# Patient Record
Sex: Female | Born: 1960 | Race: White | Hispanic: No | Marital: Married | Smoking: Never smoker
Health system: Southern US, Community
[De-identification: ages and names within clinical notes are randomized; demographics above are authoritative.]

## PROBLEM LIST (undated history)

## (undated) ENCOUNTER — Emergency Department (HOSPITAL_BASED_OUTPATIENT_CLINIC_OR_DEPARTMENT_OTHER): Admission: EM | Payer: Self-pay | Source: Home / Self Care

## (undated) DIAGNOSIS — K219 Gastro-esophageal reflux disease without esophagitis: Secondary | ICD-10-CM

## (undated) DIAGNOSIS — J069 Acute upper respiratory infection, unspecified: Secondary | ICD-10-CM

## (undated) DIAGNOSIS — Z9889 Other specified postprocedural states: Secondary | ICD-10-CM

## (undated) DIAGNOSIS — T7840XA Allergy, unspecified, initial encounter: Secondary | ICD-10-CM

## (undated) DIAGNOSIS — Z8619 Personal history of other infectious and parasitic diseases: Secondary | ICD-10-CM

## (undated) DIAGNOSIS — I10 Essential (primary) hypertension: Secondary | ICD-10-CM

## (undated) DIAGNOSIS — E785 Hyperlipidemia, unspecified: Secondary | ICD-10-CM

## (undated) DIAGNOSIS — I219 Acute myocardial infarction, unspecified: Secondary | ICD-10-CM

## (undated) HISTORY — DX: Hyperlipidemia, unspecified: E78.5

## (undated) HISTORY — DX: Gastro-esophageal reflux disease without esophagitis: K21.9

## (undated) HISTORY — DX: Acute myocardial infarction, unspecified: I21.9

## (undated) HISTORY — PX: WISDOM TOOTH EXTRACTION: SHX21

## (undated) HISTORY — PX: DILATION AND CURETTAGE OF UTERUS: SHX78

## (undated) HISTORY — DX: Essential (primary) hypertension: I10

## (undated) HISTORY — PX: ADENOIDECTOMY: SUR15

## (undated) HISTORY — DX: Personal history of other infectious and parasitic diseases: Z86.19

## (undated) HISTORY — DX: Other specified postprocedural states: Z98.890

## (undated) HISTORY — DX: Allergy, unspecified, initial encounter: T78.40XA

## (undated) HISTORY — DX: Acute upper respiratory infection, unspecified: J06.9

## (undated) HISTORY — PX: TONSILLECTOMY: SUR1361

---

## 2013-05-13 LAB — HM COLONOSCOPY

## 2017-08-20 LAB — HM MAMMOGRAPHY

## 2018-05-12 ENCOUNTER — Ambulatory Visit: Payer: BLUE CROSS/BLUE SHIELD | Admitting: Family Medicine

## 2018-05-12 ENCOUNTER — Telehealth: Payer: Self-pay | Admitting: Family Medicine

## 2018-05-12 NOTE — Telephone Encounter (Signed)
See note  Copied from [place] [id number]. Topic: Appointment Scheduling - Scheduling Inquiry for Clinic >> May 12, 2018  8:42 AM Burchel, Abbi R wrote: Reason for CRM:   Pt called to ask about her new pt appt bc she also has a cough/congestion.  She was offered an evisit instead and declined bc she has a rash she would also like examined.  Please advise asap.

## 2018-05-12 NOTE — Telephone Encounter (Signed)
Called and spoke with patient and advised that we will have to cancel her NP EST appt at this time.  Due to patient being symptomatic w/cough and congestion, we are not seeing patients in our office at this time.  Patient states that she has hypertension and reports that she was taking Mucinex and Allegra currently for her symptoms.  She was concerned about any medication affecting her bp.  Advised Coricidin HBP and push fluids, self-quarantine for 7-14 days depending on symptoms.  Patient also reports that they are in the process of moving and need to direct movers where to put furniture etc.  I advised to maintain social distance of at least 6 ft.  Advised her that we would cancel her appt and she can c/b to r/s at a later date.  She verbalized understanding

## 2018-05-20 ENCOUNTER — Ambulatory Visit: Payer: Self-pay | Admitting: *Deleted

## 2018-05-20 NOTE — Telephone Encounter (Signed)
Pt called and left a voicemail that stated she is still not feeling better and would like a call back from nurse. Please advise

## 2018-05-20 NOTE — Telephone Encounter (Signed)
  Pt called in c/o a post nasal drip and a cough that is not getting better.   "I just moved down here from [place]".   "I don't know if it's the allergies here or what".   She had an appt to meet and greet with Dr. Yves Dill however the appt was canceled due to the coronavirus pandemic going on right now. She was wanting a recommendation to use for a antihistamine OTC.   After discussing different OTC antihistamines she decided she will try the Allegra and continue taking the Mucinex.    I instructed her to call us back if no better in a week of taking the antihistamine.   She was agreeable to this.   Reason for Disposition . Cough with cold symptoms (e.g., runny nose, postnasal drip, throat clearing)  Answer Assessment - Initial Assessment Questions 1. ONSET: "When did the cough begin?"      I had a meet and greet with Dr. Yves Dill.   [facility]) I post nasal drip.   My cough is not better.   Maybe allergies.  I just moved here.   The allergies here are bad.   2. SEVERITY: "How bad is the cough today?"      The cough has not changed.   I'm sleeping ok.    3. RESPIRATORY DISTRESS: "Describe your breathing."      No respiratory distress.   I'm coughing up clear mucus.  No runny nose.   4. FEVER: "Do you have a fever?" If so, ask: "What is your temperature, how was it measured, and when did it start?"     Don't have a thermometer.   I don't think so. 5. SPUTUM: "Describe the color of your sputum" (clear, white, yellow, green)     Clear, sputum 6. HEMOPTYSIS: "Are you coughing up any blood?" If so ask: "How much?" (flecks, streaks, tablespoons, etc.)     No 7. CARDIAC HISTORY: "Do you have any history of heart disease?" (e.g., heart attack, congestive heart failure)      Hypertension. 8. LUNG HISTORY: "Do you have any history of lung disease?"  (e.g., pulmonary embolus, asthma, emphysema)     No 9. PE RISK FACTORS: "Do you have a history of blood clots?" (or: recent major surgery, recent  prolonged travel, bedridden)     Never 10. OTHER SYMPTOMS: "Do you have any other symptoms?" (e.g., runny nose, wheezing, chest pain)       Talking makes me cough.   I have post nasal drip.    11. PREGNANCY: "Is there any chance you are pregnant?" "When was your last menstrual period?"       No 12. TRAVEL: "Have you traveled out of the country in the last month?" (e.g., travel history, exposures)       No travels.  Moved here Feb 10th.  Protocols used: COUGH - ACUTE PRODUCTIVE-A-AH

## 2018-05-21 NOTE — Telephone Encounter (Signed)
Does she have a previous PCP that she was seeing? We just can't bring her in office for respiratory symptoms and she is a new patient so I can not just treat as she is not under my care.  Orma Flaming, MD [place]

## 2018-05-21 NOTE — Telephone Encounter (Signed)
Spoke to patient and advised that since we are not her PCP that we technically cannot advise her w/any further regarding her sx.  Did suggest to patient to go to Urgent Care.    Patient stating that her cough had picked up and not feeling better since she last spoke to me.  Again, advised to go to Urgent Care to be evaluated, pt verbalized understanding.

## 2018-06-13 ENCOUNTER — Encounter: Payer: Self-pay | Admitting: Family Medicine

## 2018-06-13 ENCOUNTER — Ambulatory Visit (INDEPENDENT_AMBULATORY_CARE_PROVIDER_SITE_OTHER): Payer: BLUE CROSS/BLUE SHIELD | Admitting: Family Medicine

## 2018-06-13 ENCOUNTER — Other Ambulatory Visit: Payer: Self-pay

## 2018-06-13 VITALS — BP 150/80 | HR 91 | Temp 98.7°F | Ht 61.0 in | Wt 135.0 lb

## 2018-06-13 DIAGNOSIS — I10 Essential (primary) hypertension: Secondary | ICD-10-CM | POA: Insufficient documentation

## 2018-06-13 DIAGNOSIS — Z114 Encounter for screening for human immunodeficiency virus [HIV]: Secondary | ICD-10-CM

## 2018-06-13 DIAGNOSIS — Z23 Encounter for immunization: Secondary | ICD-10-CM

## 2018-06-13 DIAGNOSIS — E559 Vitamin D deficiency, unspecified: Secondary | ICD-10-CM

## 2018-06-13 DIAGNOSIS — Z1159 Encounter for screening for other viral diseases: Secondary | ICD-10-CM | POA: Diagnosis not present

## 2018-06-13 DIAGNOSIS — E782 Mixed hyperlipidemia: Secondary | ICD-10-CM | POA: Insufficient documentation

## 2018-06-13 DIAGNOSIS — Z0000 Encounter for general adult medical examination without abnormal findings: Secondary | ICD-10-CM

## 2018-06-13 MED ORDER — KETOCONAZOLE 2 % EX SHAM: MEDICATED_SHAMPOO | CUTANEOUS | 3 refills | Status: DC

## 2018-06-13 MED ORDER — HYDROCHLOROTHIAZIDE 25 MG PO TABS: 25.0000 mg | ORAL_TABLET | Freq: Every day | ORAL | 3 refills | Status: DC

## 2018-06-13 MED ORDER — TRIAMCINOLONE ACETONIDE 0.1 % EX CREA: 1.0000 "application " | TOPICAL_CREAM | Freq: Two times a day (BID) | CUTANEOUS | 0 refills | Status: DC

## 2018-06-13 NOTE — Patient Instructions (Addendum)
-  increasing your blood pressure pill to 25mg  daily. Sent in new pill for you to take. Can just double up on your 12.5mg  pills until new px arrives. Will see you back in 2 weeks for nurse BP check/labs.   -labs future ordered. Come back in 2 weeks fasting. No food/drink except for water/coffee/tea x 8 hours.   -refilled all medication for you.  -for allergies, continue zyrtec and can add on flonase as well. Do not use zyrtec-D.   So nice to meet you!

## 2018-06-13 NOTE — Progress Notes (Signed)
Patient: Shari Cook MRN: [id number] DOB: May 21, 1960 PCP: Orma Flaming, MD     Subjective:  Chief Complaint  Patient presents with  . Establish Care    HPI: The patient is a 58 y.o. female who presents today for annual exam. She denies any changes to past medical history. There have been no recent hospitalizations. They are following a well balanced diet and exercise plan. Weight has been stable. No complaints today.   Hypertension: Here for follow up of hypertension.  Currently on hctz 12.5mg  . Homze readings range from 810 FBPZWCHE/52 diastolic. Takes medication as prescribed and denies any side effects. Exercise includes walk x 20 minutes. Weight has been stable. Denies any chest pain, headaches, shortness of breath, vision changes, swelling in lower extremities.   Hyperlipidemia: She has been told she has high cholesterol. Tried 2 different statins, but she was unable to tolerate them. Did a calcium channel score and this was zero. Was told by NP to try a red yeast. She just started taking this recently again.    Immunization History  Administered Date(s) Administered  . Zoster Recombinat (Shingrix) 12/15/2017, 06/13/2018    Colonoscopy: 2015. Repeat 10 years Mammogram: 04/2017: normal  Pap smear: 04/2017. Normal. Repeat in 3-5 years.  Tdap: 2014?    Review of Systems  Constitutional: Negative for chills, fatigue and fever.  HENT: Negative for dental problem, ear pain, hearing loss and trouble swallowing.   Eyes: Negative for visual disturbance.  Respiratory: Negative for cough, chest tightness and shortness of breath.   Cardiovascular: Negative for chest pain, palpitations and leg swelling.  Gastrointestinal: Negative for abdominal pain, blood in stool, diarrhea and nausea.  Endocrine: Negative for cold intolerance, polydipsia, polyphagia and polyuria.  Genitourinary: Negative for dysuria and hematuria.  Musculoskeletal: Negative for arthralgias.  Skin: Negative  for rash.  Neurological: Negative for dizziness and headaches.  Psychiatric/Behavioral: Negative for dysphoric mood and sleep disturbance. The patient is not nervous/anxious.     Allergies Patient has No Known Allergies.  Past Medical History Patient  has a past medical history of Allergy, Hyperlipidemia, and Hypertension.  Surgical History Patient  has a past surgical history that includes Tonsillectomy and Wisdom tooth extraction.  Family History Pateint's family history includes Arthritis in her mother; Cancer in her mother; Diabetes in her maternal grandmother and mother; Early death in her father; Heart disease in her father; Hypertension in her father; Stroke in her son.  Social History Patient  reports that she has never smoked. She has never used smokeless tobacco. She reports current alcohol use. She reports that she does not use drugs.    Objective: Vitals:   06/13/18 1308 06/13/18 1339  BP: (!) 154/90 (!) 150/80  Pulse: 91   Temp: 98.7 F (37.1 C)   TempSrc: Oral   Weight: 135 lb (61.2 kg)   Height: 5\' 1"  (1.549 m)     Body mass index is 25.51 kg/m.  Physical Exam Vitals signs reviewed.  Constitutional:      Appearance: Normal appearance. She is well-developed.  HENT:     Head: Normocephalic and atraumatic.     Right Ear: Tympanic membrane, ear canal and external ear normal.     Left Ear: Tympanic membrane, ear canal and external ear normal.     Nose: Nose normal.  Eyes:     Conjunctiva/sclera: Conjunctivae normal.     Pupils: Pupils are equal, round, and reactive to light.  Neck:     Musculoskeletal: Normal range of motion and  neck supple.     Thyroid: No thyromegaly.  Cardiovascular:     Rate and Rhythm: Normal rate and regular rhythm.     Heart sounds: Normal heart sounds. No murmur.  Pulmonary:     Effort: Pulmonary effort is normal.     Breath sounds: Normal breath sounds.  Abdominal:     General: Bowel sounds are normal. There is no  distension.     Palpations: Abdomen is soft.     Tenderness: There is no abdominal tenderness.  Lymphadenopathy:     Cervical: No cervical adenopathy.  Skin:    General: Skin is warm and dry.     Findings: No rash.  Neurological:     Mental Status: She is alert and oriented to person, place, and time.     Cranial Nerves: No cranial nerve deficit.     Coordination: Coordination normal.     Deep Tendon Reflexes: Reflexes normal.  Psychiatric:        Behavior: Behavior normal.      Office Visit from 06/13/2018 in [place]  PHQ-2 Total Score  0          Assessment/plan: 1. Annual physical exam Requesting records. Information given on mmg. Second shringrinx given today. utd on health maintenance. Just moved a few months ago from [place]. Continue healthy lifestyle/diet.  Patient counseling [x]    Nutrition: Stressed importance of moderation in sodium/caffeine intake, saturated fat and cholesterol, caloric balance, sufficient intake of fresh fruits, vegetables, fiber, calcium, iron, and 1 mg of folate supplement per day (for females capable of pregnancy).  [x]    Stressed the importance of regular exercise.   []    Substance Abuse: Discussed cessation/primary prevention of tobacco, alcohol, or other drug use; driving or other dangerous activities under the influence; availability of treatment for abuse.   [x]    Injury prevention: Discussed safety belts, safety helmets, smoke detector, smoking near bedding or upholstery.   [x]    Sexuality: Discussed sexually transmitted diseases, partner selection, use of condoms, avoidance of unintended pregnancy  and contraceptive alternatives.  [x]    Dental health: Discussed importance of regular tooth brushing, flossing, and dental visits.  [x]    Health maintenance and immunizations reviewed. Please refer to Health maintenance section.    - TSH; Future  2. Benign essential HTN Blood pressure is not to goal. Increasing her  hctz to 25mg , requesting she keep a log and will f/u in 2 weeks for nurse BP visit with labs.  Recommended routine exercise and healthy diet including DASH diet and mediterranean diet.  - Comprehensive metabolic panel; Future - CBC with Differential/Platelet; Future  3. Hyperlipidemia, mixed  - Lipid panel; Future  4. Encounter for screening for HIV  - HIV Antibody (routine testing w rflx); Future  5. Encounter for hepatitis C screening test for low risk patient  - Hepatitis C antibody; Future  6. Vitamin D deficiency  - VITAMIN D 25 Hydroxy (Vit-D Deficiency, Fractures); Future  7. Need for shingles vaccine  - Varicella-zoster vaccine IM (Shingrix)    Return in about 2 weeks (around 06/27/2018) for nurse bp check/labs .       Orma Flaming, MD [place]  06/13/2018

## 2018-06-16 ENCOUNTER — Encounter: Payer: Self-pay | Admitting: Family Medicine

## 2018-06-18 ENCOUNTER — Other Ambulatory Visit: Payer: Self-pay | Admitting: Family Medicine

## 2018-06-18 MED ORDER — TRIAMCINOLONE ACETONIDE 0.1 % EX CREA: 1.0000 "application " | TOPICAL_CREAM | Freq: Two times a day (BID) | CUTANEOUS | 0 refills | Status: AC

## 2018-06-23 ENCOUNTER — Other Ambulatory Visit: Payer: Self-pay

## 2018-06-23 ENCOUNTER — Telehealth: Payer: Self-pay | Admitting: Family Medicine

## 2018-06-23 MED ORDER — KETOCONAZOLE 2 % EX SHAM: MEDICATED_SHAMPOO | CUTANEOUS | 3 refills | Status: DC

## 2018-06-23 NOTE — Telephone Encounter (Signed)
Rx refill for ketoconazole sent in to [facility]

## 2018-06-23 NOTE — Telephone Encounter (Signed)
See request °

## 2018-06-23 NOTE — Telephone Encounter (Signed)
Copied from [place] [id number]. Topic: Quick Communication - Rx Refill/Question >> Jun 23, 2018  9:00 AM Pauline Good wrote: Medication: Ketoconazole shampoo 2%  Has the patient contacted their pharmacy? Pharmacy is calling (Agent: If no, request that the patient contact the pharmacy for the refill.) (Agent: If yes, when and what did the pharmacy advise?)  Preferred Pharmacy (with phone number or street name): [facility] mail order..order # [id number]  Agent: Please be advised that RX refills may take up to 3 business days. We ask that you follow-up with your pharmacy.

## 2018-06-26 ENCOUNTER — Other Ambulatory Visit: Payer: Self-pay

## 2018-06-26 ENCOUNTER — Encounter: Payer: Self-pay | Admitting: Family Medicine

## 2018-06-26 ENCOUNTER — Ambulatory Visit (INDEPENDENT_AMBULATORY_CARE_PROVIDER_SITE_OTHER): Payer: BLUE CROSS/BLUE SHIELD | Admitting: Family Medicine

## 2018-06-26 ENCOUNTER — Other Ambulatory Visit (INDEPENDENT_AMBULATORY_CARE_PROVIDER_SITE_OTHER): Payer: BLUE CROSS/BLUE SHIELD

## 2018-06-26 VITALS — BP 142/90 | HR 85

## 2018-06-26 DIAGNOSIS — I10 Essential (primary) hypertension: Secondary | ICD-10-CM

## 2018-06-26 DIAGNOSIS — E782 Mixed hyperlipidemia: Secondary | ICD-10-CM

## 2018-06-26 DIAGNOSIS — E559 Vitamin D deficiency, unspecified: Secondary | ICD-10-CM | POA: Diagnosis not present

## 2018-06-26 DIAGNOSIS — Z1159 Encounter for screening for other viral diseases: Secondary | ICD-10-CM

## 2018-06-26 DIAGNOSIS — Z114 Encounter for screening for human immunodeficiency virus [HIV]: Secondary | ICD-10-CM

## 2018-06-26 DIAGNOSIS — Z0000 Encounter for general adult medical examination without abnormal findings: Secondary | ICD-10-CM | POA: Diagnosis not present

## 2018-06-26 LAB — LIPID PANEL
Cholesterol: 255 mg/dL — ABNORMAL HIGH (ref 0–200)
HDL: 94.7 mg/dL (ref >39.00)
LDL Cholesterol: 138 mg/dL — ABNORMAL HIGH (ref 0–99)
NonHDL: 160.22
Total CHOL/HDL Ratio: 3
Triglycerides: 111 mg/dL (ref 0.0–149.0)
VLDL: 22.2 mg/dL (ref 0.0–40.0)

## 2018-06-26 LAB — COMPREHENSIVE METABOLIC PANEL
ALT: 26 U/L (ref 0–35)
AST: 22 U/L (ref 0–37)
Albumin: 4.5 g/dL (ref 3.5–5.2)
Alkaline Phosphatase: 51 U/L (ref 39–117)
BUN: 14 mg/dL (ref 6–23)
CO2: 30 mEq/L (ref 19–32)
Calcium: 9.7 mg/dL (ref 8.4–10.5)
Chloride: 101 mEq/L (ref 96–112)
Creatinine, Ser: 0.85 mg/dL (ref 0.40–1.20)
GFR: 69.32 mL/min (ref >60.00)
Glucose, Bld: 87 mg/dL (ref 70–99)
Potassium: 3.9 mEq/L (ref 3.5–5.1)
Sodium: 140 mEq/L (ref 135–145)
Total Bilirubin: 1 mg/dL (ref 0.2–1.2)
Total Protein: 6.7 g/dL (ref 6.0–8.3)

## 2018-06-26 LAB — CBC WITH DIFFERENTIAL/PLATELET
Basophils Absolute: 0.1 10*3/uL (ref 0.0–0.1)
Basophils Relative: 2 % (ref 0.0–3.0)
Eosinophils Absolute: 0.2 10*3/uL (ref 0.0–0.7)
Eosinophils Relative: 4.5 % (ref 0.0–5.0)
HCT: 43 % (ref 36.0–46.0)
Hemoglobin: 14.7 g/dL (ref 12.0–15.0)
Lymphocytes Relative: 30.7 % (ref 12.0–46.0)
Lymphs Abs: 1.6 10*3/uL (ref 0.7–4.0)
MCHC: 34.2 g/dL (ref 30.0–36.0)
MCV: 90.1 fl (ref 78.0–100.0)
Monocytes Absolute: 0.4 10*3/uL (ref 0.1–1.0)
Monocytes Relative: 8.8 % (ref 3.0–12.0)
Neutro Abs: 2.7 10*3/uL (ref 1.4–7.7)
Neutrophils Relative %: 54 % (ref 43.0–77.0)
Platelets: 345 10*3/uL (ref 150.0–400.0)
RBC: 4.77 Mil/uL (ref 3.87–5.11)
RDW: 13.1 % (ref 11.5–15.5)
WBC: 5.1 10*3/uL (ref 4.0–10.5)

## 2018-06-26 LAB — VITAMIN D 25 HYDROXY (VIT D DEFICIENCY, FRACTURES): VITD: 34.03 ng/mL (ref 30.00–100.00)

## 2018-06-26 LAB — TSH: TSH: 2.62 u[IU]/mL (ref 0.35–4.50)

## 2018-06-26 NOTE — Progress Notes (Signed)
Pt here for blood pressure check, per orders of Dr. Rogers Blocker.    Currently taking 25 mg of Hydrodiuril daily; was previously taking 12.5 mg qd per her cardiologist in [place], [place].  Recently moved here a few months ago.  Feels that she is tolerating medication well, denies any CP, dizziness or headaches and no visual changes.  Patient had brought her home cuff with her to compare.  Reading was 120/86.  Repeat bp on our home cuff was 142/90.  Per Dr. Rogers Blocker, patient should continue with current plan of taking 25 mg of HCTZ qd and follow up in 3 mos w/Dr. Jonni Sanger.  Patient verbalized understanding.

## 2018-06-27 LAB — HEPATITIS C ANTIBODY
Hepatitis C Ab: NONREACTIVE
SIGNAL TO CUT-OFF: 0.01 (ref <1.00)

## 2018-06-27 LAB — HIV ANTIBODY (ROUTINE TESTING W REFLEX): HIV 1&2 Ab, 4th Generation: NONREACTIVE

## 2018-06-30 ENCOUNTER — Encounter: Payer: Self-pay | Admitting: Family Medicine

## 2018-07-03 ENCOUNTER — Encounter: Payer: Self-pay | Admitting: Family Medicine

## 2018-07-03 DIAGNOSIS — Z8249 Family history of ischemic heart disease and other diseases of the circulatory system: Secondary | ICD-10-CM | POA: Insufficient documentation

## 2018-07-29 ENCOUNTER — Encounter: Payer: Self-pay | Admitting: Family Medicine

## 2018-07-30 DIAGNOSIS — R0982 Postnasal drip: Secondary | ICD-10-CM | POA: Diagnosis not present

## 2018-07-30 DIAGNOSIS — I10 Essential (primary) hypertension: Secondary | ICD-10-CM | POA: Diagnosis not present

## 2018-08-11 NOTE — Telephone Encounter (Signed)
I called and spoke with the patient after speaking with Lea who confirmed that our credentialing team is aware of this and has been working to make the necessary changes. The patient appreciated my call and said that she had already started looking for another provider due to insurance insisting that the provider was not in network. She stated she understood that she was however, would let us know what she decides to do. No further action required. No need to route, only for documentation purposes.

## 2018-10-27 ENCOUNTER — Encounter: Payer: Self-pay | Admitting: Family Medicine

## 2018-10-28 ENCOUNTER — Encounter: Payer: Self-pay | Admitting: Family Medicine

## 2018-10-29 ENCOUNTER — Other Ambulatory Visit: Payer: Self-pay

## 2018-10-29 ENCOUNTER — Ambulatory Visit (INDEPENDENT_AMBULATORY_CARE_PROVIDER_SITE_OTHER): Payer: BC Managed Care – PPO | Admitting: Family Medicine

## 2018-10-29 ENCOUNTER — Encounter: Payer: Self-pay | Admitting: Family Medicine

## 2018-10-29 VITALS — BP 116/68 | HR 88 | Temp 98.1°F | Ht 61.0 in | Wt 127.4 lb

## 2018-10-29 DIAGNOSIS — I10 Essential (primary) hypertension: Secondary | ICD-10-CM

## 2018-10-29 NOTE — Progress Notes (Signed)
Patient: Shari Cook MRN: [id number] DOB: May 21, 1960 PCP: Orma Flaming, MD     Subjective:  Chief Complaint  Patient presents with  . Hypertension    follow up    HPI: The patient is a 58 y.o. female who presents today for follow up on hypertension.  She is still on 25mg  hctz.   Hypertension: Here for follow up of hypertension.  Currently on hctz 25mg  . Home readings range from [id number] diastolic. Takes medication as prescribed and denies any side effects. Exercise includes walking and stretching. Weight has been stable. Denies any chest pain, headaches, shortness of breath, vision changes, swelling in lower extremities. Biggest complaint is dry eyes. She wears glasses, no contacts.    Review of Systems  Constitutional: Negative for chills and fatigue.  Eyes: Negative for visual disturbance.  Respiratory: Negative for cough, chest tightness and shortness of breath.   Cardiovascular: Negative for chest pain, palpitations and leg swelling.  Gastrointestinal: Negative for abdominal pain, diarrhea, nausea and vomiting.  Musculoskeletal: Negative for back pain and neck pain.  Neurological: Negative for dizziness and headaches.  Psychiatric/Behavioral: Negative for sleep disturbance.    Allergies Patient is allergic to statins.  Past Medical History Patient  has a past medical history of Allergy, History of chicken pox, Hyperlipidemia, and Hypertension.  Surgical History Patient  has a past surgical history that includes Tonsillectomy and Wisdom tooth extraction.  Family History Pateint's family history includes Arthritis in her mother; Cancer in her mother; Diabetes in her maternal grandmother and mother; Early death in her father; Heart attack in her father; Heart disease in her father; Hypertension in her father; Stroke in her son.  Social History Patient  reports that she has never smoked. She has never used smokeless tobacco. She reports current  alcohol use. She reports that she does not use drugs.    Objective: Vitals:   10/29/18 1113  BP: 116/68  Pulse: 88  Temp: 98.1 F (36.7 C)  TempSrc: Skin  SpO2: 98%  Weight: 127 lb 6.4 oz (57.8 kg)  Height: 5\' 1"  (1.549 m)    Body mass index is 24.07 kg/m.  Physical Exam Vitals signs reviewed.  Constitutional:      Appearance: Normal appearance. She is normal weight.  HENT:     Head: Normocephalic and atraumatic.  Eyes:     Extraocular Movements: Extraocular movements intact.     Conjunctiva/sclera: Conjunctivae normal.     Pupils: Pupils are equal, round, and reactive to light.  Cardiovascular:     Rate and Rhythm: Normal rate and regular rhythm.     Pulses: Normal pulses.     Heart sounds: Normal heart sounds.  Pulmonary:     Effort: Pulmonary effort is normal.     Breath sounds: Normal breath sounds.  Abdominal:     General: Abdomen is flat. Bowel sounds are normal.     Palpations: Abdomen is soft.  Neurological:     General: No focal deficit present.     Mental Status: She is alert and oriented to person, place, and time.  Psychiatric:        Mood and Affect: Mood normal.        Behavior: Behavior normal.        Assessment/plan: 1. Benign essential HTN She is to goal. Continue current dosage. No refills needed. F/u for routine annual/labs in may 2021. Let me know if any dizziness/light headedness or extreme fatigue and low pressures.   Return if symptoms worsen or  fail to improve.    Orma Flaming, MD [place]   10/29/2018

## 2018-11-12 ENCOUNTER — Encounter: Payer: Self-pay | Admitting: Family Medicine

## 2018-11-18 ENCOUNTER — Encounter: Payer: Self-pay | Admitting: Obstetrics and Gynecology

## 2018-11-19 ENCOUNTER — Other Ambulatory Visit: Payer: Self-pay

## 2018-11-19 ENCOUNTER — Encounter: Payer: Self-pay | Admitting: Family Medicine

## 2018-11-19 ENCOUNTER — Ambulatory Visit (INDEPENDENT_AMBULATORY_CARE_PROVIDER_SITE_OTHER): Payer: BC Managed Care – PPO | Admitting: Family Medicine

## 2018-11-19 VITALS — BP 152/80 | HR 83 | Temp 98.1°F | Ht 61.0 in | Wt 127.2 lb

## 2018-11-19 DIAGNOSIS — E041 Nontoxic single thyroid nodule: Secondary | ICD-10-CM | POA: Diagnosis not present

## 2018-11-19 DIAGNOSIS — Z23 Encounter for immunization: Secondary | ICD-10-CM

## 2018-11-19 NOTE — Patient Instructions (Signed)
Your imaging referral has been sent to [facility].  Their phone number is [phone].  Please wait 2 business days before calling them to schedule your appointment.  -flu shot today  -will let you know when I get thyroid ultrasound back.   -watch BP, but im not worried. Let me know if consistently stays elevated.

## 2018-11-19 NOTE — Progress Notes (Signed)
Patient: Shari Cook MRN: [id number] DOB: 08-22-1960 PCP: Orma Flaming, MD     Subjective:  Chief Complaint  Patient presents with  . Thyroid Problem    HPI: The patient is a 58 y.o. female who presents today for possible enlarged thyroid? No c/o pain or tenderness. She was seen at dentist and he advised to come in to see physician. She can also feel a difference and states one side is bigger than the other and feels like the left side is bigger. No problems swallowing, no dry skin, no leg swelling, constipation. No mood issues. No FH of thyroid issues in parents.   Review of Systems  Constitutional: Negative for fatigue.  HENT: Positive for postnasal drip. Negative for congestion, rhinorrhea and sore throat.   Eyes:       Dry eyes  Respiratory: Negative for chest tightness and shortness of breath.   Cardiovascular: Negative for chest pain, palpitations and leg swelling.  Gastrointestinal: Negative for abdominal pain, diarrhea, nausea and vomiting.  Musculoskeletal: Negative for back pain.  Neurological: Negative for dizziness and headaches.  Psychiatric/Behavioral: Negative for sleep disturbance.    Allergies Patient is allergic to statins.  Past Medical History Patient  has a past medical history of Allergy, History of chicken pox, Hyperlipidemia, and Hypertension.  Surgical History Patient  has a past surgical history that includes Tonsillectomy and Wisdom tooth extraction.  Family History Pateint's family history includes Arthritis in her mother; Cancer in her mother; Diabetes in her maternal grandmother and mother; Early death in her father; Heart attack in her father; Heart disease in her father; Hypertension in her father; Stroke in her son.  Social History Patient  reports that she has never smoked. She has never used smokeless tobacco. She reports current alcohol use. She reports that she does not use drugs.    Objective: Vitals:   11/19/18 1307  11/19/18 1334  BP: (!) 142/90 (!) 152/80  Pulse: 83   Temp: 98.1 F (36.7 C)   TempSrc: Skin   SpO2: 98%   Weight: 127 lb 3.2 oz (57.7 kg)   Height: 5\' 1"  (1.549 m)     Body mass index is 24.03 kg/m.  Physical Exam Constitutional:      Appearance: Normal appearance. She is normal weight.  HENT:     Head: Normocephalic and atraumatic.  Neck:     Comments: Left lower lobe of thyroid. 2cm enlarged mass. Mobile, slightly tender  Cardiovascular:     Rate and Rhythm: Normal rate and regular rhythm.     Heart sounds: Normal heart sounds.  Pulmonary:     Effort: Pulmonary effort is normal.     Breath sounds: Normal breath sounds.  Abdominal:     General: Abdomen is flat. Bowel sounds are normal.     Palpations: Abdomen is soft.  Skin:    General: Skin is warm.     Capillary Refill: Capillary refill takes less than 2 seconds.  Neurological:     General: No focal deficit present.     Mental Status: She is alert and oriented to person, place, and time.        Assessment/plan: 1. Thyroid nodule Mass on inferior pole of left side of thyroid. Ultrasound ordered. Tsh normal in may and no symptoms. Do not think she needs labs at this point. F/u on imaging.  - US Soft Tissue Head/Neck; Future  -flu shot today.  -mmg referral discussed.    Return if symptoms worsen or fail to improve.  Orma Flaming, MD [place]   11/19/2018

## 2018-11-20 ENCOUNTER — Other Ambulatory Visit: Payer: Self-pay | Admitting: Family Medicine

## 2018-11-20 DIAGNOSIS — E041 Nontoxic single thyroid nodule: Secondary | ICD-10-CM

## 2018-11-24 NOTE — Addendum Note (Signed)
Addended by: Kevan Ny on: 11/24/2018 02:19 PM   Modules accepted: Orders

## 2018-11-25 ENCOUNTER — Other Ambulatory Visit: Payer: Self-pay | Admitting: Family Medicine

## 2018-11-25 DIAGNOSIS — Z1231 Encounter for screening mammogram for malignant neoplasm of breast: Secondary | ICD-10-CM

## 2018-11-26 ENCOUNTER — Encounter: Payer: Self-pay | Admitting: Family Medicine

## 2018-11-26 ENCOUNTER — Ambulatory Visit
Admission: RE | Admit: 2018-11-26 | Discharge: 2018-11-26 | Disposition: A | Discharge status: Home / Self Care | Payer: BC Managed Care – PPO | Source: Ambulatory Visit | Attending: Family Medicine | Admitting: Family Medicine

## 2018-11-26 ENCOUNTER — Other Ambulatory Visit: Payer: Self-pay | Admitting: Family Medicine

## 2018-11-26 DIAGNOSIS — E041 Nontoxic single thyroid nodule: Secondary | ICD-10-CM | POA: Diagnosis not present

## 2018-11-26 DIAGNOSIS — E042 Nontoxic multinodular goiter: Secondary | ICD-10-CM

## 2018-11-27 ENCOUNTER — Ambulatory Visit: Payer: BC Managed Care – PPO

## 2018-12-11 ENCOUNTER — Encounter: Payer: Self-pay | Admitting: Family Medicine

## 2018-12-19 ENCOUNTER — Other Ambulatory Visit: Payer: Self-pay

## 2018-12-22 ENCOUNTER — Encounter: Payer: Self-pay | Admitting: Obstetrics and Gynecology

## 2018-12-22 ENCOUNTER — Other Ambulatory Visit: Payer: Self-pay

## 2018-12-22 ENCOUNTER — Ambulatory Visit: Payer: BC Managed Care – PPO | Admitting: Obstetrics and Gynecology

## 2018-12-22 VITALS — BP 140/78 | HR 74 | Temp 98.2°F | Ht 61.0 in | Wt 125.0 lb

## 2018-12-22 DIAGNOSIS — Z01419 Encounter for gynecological examination (general) (routine) without abnormal findings: Secondary | ICD-10-CM | POA: Diagnosis not present

## 2018-12-22 NOTE — Patient Instructions (Signed)

## 2018-12-22 NOTE — Progress Notes (Addendum)
58 y.o. G61P2002 Married White or Caucasian Not Hispanic or Latino female here for annual exam.  PMP. She reports mild face flushing, no significant hot flashes, no night sweats.  No libido, no h/o vaginal dryness, not sexually active.  She has an enlarged thyroid, 2 left sided nodules. Needs a biopsy, has a Consultation with an Endocrinologist on 12/30/18.   In the last 1-2 weeks she has noticed a fullness on the outer left breast. She doesn't feel anything, slightly tender.     Patient's last menstrual period was 10/20/2017.          Sexually active: Yes.    The current method of family planning is none.    Exercising: Yes.    20 min of yoga and 15-20 min walk daily  Smoker:  no  Health Maintenance: Pap:  07/04/17 negative, negative HRHPV  History of abnormal Pap:  no MMG:  Summer of 2019, scheduled later this month.   BMD:   no Colonoscopy: 2015, f/u 10 years.  TDaP:  No sure, thinks 6 years ago.   Gardasil: no    reports that she has never smoked. She has never used smokeless tobacco. She reports current alcohol use. She reports that she does not use drugs. She has moved a lot in the last many years. Moved here earlier this year, just prior to Covid. Son went to school here.  Kids are grown, one in [place], one in [place]. Has a grandchilder.   Past Medical History:  Diagnosis Date  . Allergy   . History of chicken pox   . Hyperlipidemia   . Hypertension   . Status post dilation and curettage     Past Surgical History:  Procedure Laterality Date  . TONSILLECTOMY    . WISDOM TOOTH EXTRACTION      Current Outpatient Medications  Medication Sig Dispense Refill  . Calcium-Magnesium-Vitamin D (CALCIUM MAGNESIUM PO) Take by mouth.    . diclofenac sodium (VOLTAREN) 1 % GEL Voltaren 1 % topical gel    . Garlic [id number] MG TABS Take by mouth.    . hydrochlorothiazide (HYDRODIURIL) 25 MG tablet Take 1 tablet (25 mg total) by mouth daily. 90 tablet 3  . ketoconazole (NIZORAL) 2 %  shampoo Use twice a week 120 mL 3  . Misc Natural Products (LUTEIN 20 PO) Take by mouth.    . Multiple Vitamins-Minerals (CVS SPECTRAVITE ADULT 50+ PO) Take by mouth.    . Omega-3 Fatty Acids (FISH OIL PO) Take by mouth.    . clotrimazole-betamethasone (LOTRISONE) lotion Apply topically 2 (two) times daily.     No current facility-administered medications for this visit.     Family History  Problem Relation Age of Onset  . Arthritis Mother   . Cancer Mother   . Diabetes Mother   . Early death Father   . Heart disease Father   . Hypertension Father   . Heart attack Father   . Stroke Son   . Diabetes Maternal 18   Mom had leukemia. Son has had 2 strokes, don't know why. No etiology found. He has started having aura's. He is okay, seeing a vascular neurologist.   Review of Systems  All other systems reviewed and are negative.   Exam:   BP 140/78   Pulse 74   Temp 98.2 F (36.8 C)   Ht 5\' 1"  (1.549 m)   Wt 125 lb (56.7 kg)   LMP 10/20/2017   SpO2 99%   BMI 23.62 kg/m  Weight change: @WEIGHTCHANGE @ Height:   Height: 5\' 1"  (154.9 cm)  Ht Readings from Last 3 Encounters:  12/22/18 5\' 1"  (1.549 m)  11/19/18 5\' 1"  (1.549 m)  10/29/18 5\' 1"  (1.549 m)    General appearance: alert, cooperative and appears stated age Head: Normocephalic, without obvious abnormality, atraumatic Neck: no adenopathy, supple, symmetrical, trachea midline and thyroid left thyroid nodule palpated. 1-1.5 cm Lungs: clear to auscultation bilaterally Cardiovascular: regular rate and rhythm Breasts: normal appearance, no masses or tenderness Abdomen: soft, non-tender; non distended,  no masses,  no organomegaly Extremities: extremities normal, atraumatic, no cyanosis or edema Skin: Skin color, texture, turgor normal. No rashes or lesions Lymph nodes: Cervical, supraclavicular, and axillary nodes normal. No abnormal inguinal nodes palpated Neurologic: Grossly normal   Pelvic: External  genitalia:  no lesions              Urethra:  normal appearing urethra with no masses, tenderness or lesions              Bartholins and Skenes: normal                 Vagina: normal appearing vagina with normal color and discharge, no lesions              Cervix: no lesions               Bimanual Exam:  Uterus:  normal size, contour, position, consistency, mobility, non-tender              Adnexa: no mass, fullness, tenderness               Rectovaginal: Confirms               Anus:  normal sphincter tone, no lesions  Chaperone was present for exam.  A:  Well Woman with normal exam  Thyroid nodules, has appointment for f/u  HTN, elevated lipids, managed by Dr Rogers Blocker  C/O fullness in her left breast for a few weeks,   P:   No pap needed  Mammogram scheduled.   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

## 2018-12-23 ENCOUNTER — Encounter: Payer: Self-pay | Admitting: Obstetrics and Gynecology

## 2018-12-26 ENCOUNTER — Other Ambulatory Visit: Payer: Self-pay

## 2018-12-30 ENCOUNTER — Encounter: Payer: Self-pay | Admitting: Internal Medicine

## 2018-12-30 ENCOUNTER — Ambulatory Visit: Payer: BC Managed Care – PPO | Admitting: Internal Medicine

## 2018-12-30 VITALS — BP 130/76 | HR 82 | Ht 61.0 in | Wt 125.4 lb

## 2018-12-30 DIAGNOSIS — E042 Nontoxic multinodular goiter: Secondary | ICD-10-CM | POA: Diagnosis not present

## 2018-12-30 LAB — TSH: TSH: 1.55 u[IU]/mL (ref 0.35–4.50)

## 2018-12-30 LAB — T4, FREE: Free T4: 0.89 ng/dL (ref 0.60–1.60)

## 2018-12-30 NOTE — Patient Instructions (Signed)
-   We will set you up for a left thyroid nodule biopsy, if you don't hear about it in 2 weeks, please contact our office  - If your thyroid nodule is benign a follow up is needed in 1 yr, but if you have any issues prior to that please contact our office.

## 2018-12-30 NOTE — Progress Notes (Signed)
Name: Shari Cook  MRN/ DOB: [id number], [birth year]    Age/ Sex: 58 y.o., female    PCP: Orma Flaming, MD   Reason for Endocrinology Evaluation: MNG     Date of Initial Endocrinology Evaluation: 12/30/2018     HPI: Shari Cook is a 58 y.o. female with a past medical history of HTN. The patient presented for initial endocrinology clinic visit on 12/30/2018 for consultative assistance with her MNG.   Pt was noted to have thyromegaly during routine examination at her dentist's office, she was advised to follow up with PCP which prompted an ultrasound.   Today she denies any change in thyroid nodules , denies discomfirt, no dysphagia.   No radiation exposure  Has noted weight loss, but denies diarrhea. Has occasional palpitations and postnasal drip.  Denies depression and anxiety   No Fh of thyroid disease   HISTORY:  Past Medical History:  Past Medical History:  Diagnosis Date  . Allergy   . History of chicken pox   . Hyperlipidemia   . Hypertension   . Status post dilation and curettage    Past Surgical History:  Past Surgical History:  Procedure Laterality Date  . TONSILLECTOMY    . WISDOM TOOTH EXTRACTION        Social History:  reports that she has never smoked. She has never used smokeless tobacco. She reports current alcohol use. She reports that she does not use drugs.  Family History: family history includes Arthritis in her mother; Cancer in her mother; Diabetes in her maternal grandmother and mother; Early death in her father; Heart attack in her father; Heart disease in her father; Hypertension in her father; Stroke in her son.   HOME MEDICATIONS: Allergies as of 12/30/2018      Reactions   Statins Other (See Comments)   Muscle pain w/statin drugs      Medication List       Accurate as of December 30, 2018  2:31 PM. If you have any questions, ask your nurse or doctor.        CALCIUM MAGNESIUM PO Take by mouth.    clotrimazole-betamethasone lotion Commonly known as: LOTRISONE Apply topically 2 (two) times daily.   CVS SPECTRAVITE ADULT 50+ PO Take by mouth.   FISH OIL PO Take by mouth.   Garlic [id number] MG Tabs Take by mouth.   hydrochlorothiazide 25 MG tablet Commonly known as: HYDRODIURIL Take 1 tablet (25 mg total) by mouth daily.   ketoconazole 2 % shampoo Commonly known as: NIZORAL Use twice a week   LUTEIN 20 PO Take 1 tablet by mouth daily.   Voltaren 1 % Gel Generic drug: diclofenac sodium Voltaren 1 % topical gel         REVIEW OF SYSTEMS: A comprehensive ROS was conducted with the patient and is negative except as per HPI and below:  Review of Systems  Constitutional: Positive for weight loss. Negative for malaise/fatigue.  HENT: Negative for congestion and sore throat.   Respiratory: Negative for cough and shortness of breath.   Cardiovascular: Positive for palpitations. Negative for chest pain.  Gastrointestinal: Negative for diarrhea and nausea.  Psychiatric/Behavioral: Negative for depression. The patient is not nervous/anxious.        OBJECTIVE:  VS: BP 130/76 (BP Location: Left Arm, Patient Position: Sitting, Cuff Size: Normal)   Pulse 82   Ht 5\' 1"  (1.549 m)   Wt 125 lb 6.1 oz (56.9 kg)   SpO2 97%  BMI 23.69 kg/m    Wt Readings from Last 3 Encounters:  12/30/18 125 lb 6.1 oz (56.9 kg)  12/22/18 125 lb (56.7 kg)  11/19/18 127 lb 3.2 oz (57.7 kg)     EXAM: General: Pt appears well and is in NAD  Hydration: Well-hydrated with moist mucous membranes and good skin turgor  Eyes: External eye exam normal without stare, lid lag or exophthalmos.  EOM intact.  PERRL.  Ears, Nose, Throat: Hearing: Grossly intact bilaterally Dental: Good dentition  Throat: Clear without mass, erythema or exudate  Neck: General: Supple without adenopathy. Thyroid: Thyroid size normal.  No goiter or nodules appreciated. No thyroid bruit.  Lungs: Clear with good BS bilat  with no rales, rhonchi, or wheezes  Heart: Auscultation: RRR.  Abdomen: Normoactive bowel sounds, soft, nontender, without masses or organomegaly palpable  Extremities: Gait and station: Normal gait  Digits and nails: No clubbing, cyanosis, petechiae, or nodes Head and neck: Normal alignment and mobility BL UE: Normal ROM and strength. BL LE: No pretibial edema normal ROM and strength.  Skin: Hair: Texture and amount normal with gender appropriate distribution Skin Inspection: No rashes, acanthosis nigricans/skin tags. No lipohypertrophy Skin Palpation: Skin temperature, texture, and thickness normal to palpation  Neuro: Cranial nerves: II - XII grossly intact  Cerebellar: Normal coordination and movement; no tremor Motor: Normal strength throughout DTRs: 2+ and symmetric in UE without delay in relaxation phase  Mental Status: Judgment, insight: Intact Orientation: Oriented to time, place, and person Memory: Intact for recent and remote events Mood and affect: No depression, anxiety, or agitation     DATA REVIEWED:  Results for [facility], Zyair B "PAM" (MRN [id number]) as of 12/31/2018 07:27  Ref. Range 12/30/2018 14:58  TSH Latest Ref Range: 0.35 - 4.50 uIU/mL 1.55  T4,Free(Direct) Latest Ref Range: 0.60 - 1.60 ng/dL 0.89     Thyroid Ultrasound 11/26/2018   Nodule # 1:  Location: Left; Superior  Maximum size: 1.1 cm; Other 2 dimensions: 0.5 x 0.4 cm  Composition: solid/almost completely solid (2)  Echogenicity: hypoechoic (2)  Shape: not taller-than-wide (0)  Margins: smooth (0)  Echogenic foci: none (0)  ACR TI-RADS total points: 4.  ACR TI-RADS risk category: TR4 (4-6 points).  ACR TI-RADS recommendations:  *Given size (>/= 1 - 1.4 cm) and appearance, a follow-up ultrasound in 1 year should be considered based on TI-RADS criteria.  _________________________________________________________  Nodule # 2:  Location: Left; Mid  Maximum  size: 2.7 cm; Other 2 dimensions: 2 x 1.5 cm  Composition: mixed cystic and solid (1)  Echogenicity: hypoechoic (2)  Shape: not taller-than-wide (0)  Margins: smooth (0)  Echogenic foci: punctate echogenic foci (3)  ACR TI-RADS total points: 6.  ACR TI-RADS risk category: TR4 (4-6 points).  ACR TI-RADS recommendations:  **Given size (>/= 1.5 cm) and appearance, fine needle aspiration of this moderately suspicious nodule should be considered based on TI-RADS criteria.  _________________________________________________________  IMPRESSION: 1. Heterogeneous borderline enlarged thyroid gland. 2. There is a 2.7 cm mixed cystic and solid nodule in the left mid thyroid gland. Findings meet consensus criteria for biopsy. Ultrasound-guided fine needle aspiration should be considered, as per the consensus statement: Management of Thyroid Nodules Detected at [place]: Society of Radiologists in [place]. Radiology 2005; [id number]. 3. There is a 1.1 cm nodule in the the left superior thyroid gland that meets criteria for a 1 year follow-up ultrasound.  ASSESSMENT/PLAN/RECOMMENDATIONS:   1. Thyroid Nodules:   - Pt is clinically and  biochemically euthyroid  - No local neck symptoms.  - Will  Proceed with FNA of the left mid thyroid nodules per TI-RAD criteria    F/U in 1 yr   Signed electronically by: Mack Guise, MD  [facility] Endocrinology  [facility] [address] [place], [place] [address] Phone: [phone] FAX: [phone]   CC: Orma Flaming, [place] [address] Phone: [phone] Fax: [phone]   Return to Endocrinology clinic as below: Future Appointments  Date Time Provider [place]  01/08/2019  2:40 PM [facility] MM 3 [facility]

## 2019-01-07 ENCOUNTER — Encounter: Payer: Self-pay | Admitting: Family Medicine

## 2019-01-08 ENCOUNTER — Other Ambulatory Visit: Payer: Self-pay

## 2019-01-08 ENCOUNTER — Ambulatory Visit
Admission: RE | Admit: 2019-01-08 | Discharge: 2019-01-08 | Disposition: A | Discharge status: Home / Self Care | Payer: BC Managed Care – PPO | Source: Ambulatory Visit | Attending: Family Medicine | Admitting: Family Medicine

## 2019-01-08 DIAGNOSIS — Z1231 Encounter for screening mammogram for malignant neoplasm of breast: Secondary | ICD-10-CM | POA: Diagnosis not present

## 2019-01-20 ENCOUNTER — Ambulatory Visit
Admission: RE | Admit: 2019-01-20 | Discharge: 2019-01-20 | Disposition: A | Discharge status: Home / Self Care | Payer: BC Managed Care – PPO | Source: Ambulatory Visit | Attending: Internal Medicine | Admitting: Internal Medicine

## 2019-01-20 ENCOUNTER — Other Ambulatory Visit (HOSPITAL_COMMUNITY)
Admission: RE | Admit: 2019-01-20 | Discharge: 2019-01-20 | Disposition: A | Discharge status: Home / Self Care | Payer: BC Managed Care – PPO | Source: Ambulatory Visit | Attending: Physician Assistant | Admitting: Physician Assistant

## 2019-01-20 ENCOUNTER — Encounter: Payer: Self-pay | Admitting: Family Medicine

## 2019-01-20 DIAGNOSIS — E042 Nontoxic multinodular goiter: Secondary | ICD-10-CM | POA: Insufficient documentation

## 2019-01-20 DIAGNOSIS — E041 Nontoxic single thyroid nodule: Secondary | ICD-10-CM | POA: Diagnosis not present

## 2019-01-20 NOTE — Procedures (Signed)
PROCEDURE SUMMARY:  Using direct ultrasound guidance, 5 passes were made using 25 g needles into the nodule within the left  lobe of the thyroid.   Ultrasound was used to confirm needle placements on all occasions.   EBL = trace  Specimens were sent to Pathology for analysis.  See procedure note under Imaging tab in Epic for full procedure details.  Jervon Ream S Flavius Repsher PA-C 01/20/2019 2:52 PM

## 2019-01-21 ENCOUNTER — Other Ambulatory Visit: Payer: Self-pay | Admitting: Family Medicine

## 2019-01-21 DIAGNOSIS — R928 Other abnormal and inconclusive findings on diagnostic imaging of breast: Secondary | ICD-10-CM

## 2019-01-21 LAB — CYTOLOGY - NON PAP

## 2019-01-23 ENCOUNTER — Ambulatory Visit: Payer: BC Managed Care – PPO

## 2019-01-23 ENCOUNTER — Telehealth: Payer: Self-pay | Admitting: Obstetrics and Gynecology

## 2019-01-23 ENCOUNTER — Other Ambulatory Visit: Payer: Self-pay

## 2019-01-23 ENCOUNTER — Encounter: Payer: Self-pay | Admitting: Obstetrics and Gynecology

## 2019-01-23 ENCOUNTER — Ambulatory Visit
Admission: RE | Admit: 2019-01-23 | Discharge: 2019-01-23 | Disposition: A | Discharge status: Home / Self Care | Payer: BC Managed Care – PPO | Source: Ambulatory Visit | Attending: Family Medicine | Admitting: Family Medicine

## 2019-01-23 DIAGNOSIS — R928 Other abnormal and inconclusive findings on diagnostic imaging of breast: Secondary | ICD-10-CM | POA: Diagnosis not present

## 2019-01-23 NOTE — Telephone Encounter (Signed)
Patient sent the following correspondence through [place].  Because I had scheduled my mammogram before I met you, I requested that the results go to my PCP, Dr. Orma Flaming. I was contacted by the breast center that a follow-up is required. Today (12/4) I am returning for additional imaging and possibly an ultrasound. I wanted to make you aware and would like to know if I need to do anything so you can access these results.

## 2019-01-23 NOTE — Telephone Encounter (Signed)
Spoke with patient. Advised Dr. Talbert Nan did receive copy of 01/08/19 screening MMG. Recommended patient proceed with Dx imaging as scheduled. Radiologist will review results with her today before she leaves, she can request results be sent to PCP and GYN if she chooses. Questions answered.   Patient is in MMG hold.   Routing to provider for final review. Patient is agreeable to disposition. Will close encounter.

## 2019-02-05 ENCOUNTER — Encounter: Payer: Self-pay | Admitting: Family Medicine

## 2019-02-06 ENCOUNTER — Other Ambulatory Visit: Payer: Self-pay | Admitting: Family Medicine

## 2019-02-06 MED ORDER — CLOTRIMAZOLE-BETAMETHASONE 1-0.05 % EX LOTN: TOPICAL_LOTION | Freq: Two times a day (BID) | CUTANEOUS | 1 refills | Status: DC

## 2019-03-04 ENCOUNTER — Encounter: Payer: Self-pay | Admitting: Family Medicine

## 2019-05-07 ENCOUNTER — Ambulatory Visit: Payer: Self-pay | Attending: Internal Medicine

## 2019-05-07 DIAGNOSIS — Z23 Encounter for immunization: Secondary | ICD-10-CM

## 2019-05-07 NOTE — Progress Notes (Signed)
   Covid-19 Vaccination Clinic  Name:  Shari Cook    MRN: [id number] DOB: 1961/01/21  05/07/2019  Shari Cook was observed post Covid-19 immunization for 15 minutes without incident. She was provided with Vaccine Information Sheet and instruction to access the V-Safe system.   Shari Cook was instructed to call 911 with any severe reactions post vaccine: Marland Kitchen Difficulty breathing  . Swelling of face and throat  . A fast heartbeat  . A bad rash all over body  . Dizziness and weakness   Immunizations Administered    Name Date Dose VIS Date Route   Pfizer COVID-19 Vaccine 05/07/2019  3:03 PM 0.3 mL 01/30/2019 Intramuscular   Manufacturer: [place]   Lot: [id number]   [place]: [id number]

## 2019-05-26 ENCOUNTER — Other Ambulatory Visit: Payer: Self-pay | Admitting: Family Medicine

## 2019-06-03 ENCOUNTER — Ambulatory Visit: Payer: Self-pay | Attending: Internal Medicine

## 2019-06-03 DIAGNOSIS — Z23 Encounter for immunization: Secondary | ICD-10-CM

## 2019-06-03 NOTE — Progress Notes (Signed)
   Covid-19 Vaccination Clinic  Name:  Shari Cook    MRN: [id number] DOB: 1960/10/23  06/03/2019  Shari Cook was observed post Covid-19 immunization for 15 minutes without incident. She was provided with Vaccine Information Sheet and instruction to access the V-Safe system.   Shari Cook was instructed to call 911 with any severe reactions post vaccine: Marland Kitchen Difficulty breathing  . Swelling of face and throat  . A fast heartbeat  . A bad rash all over body  . Dizziness and weakness   Immunizations Administered    Name Date Dose VIS Date Route   Pfizer COVID-19 Vaccine 06/03/2019  2:08 PM 0.3 mL 01/30/2019 Intramuscular   Manufacturer: [place]   Lot: [id number]   [place]: [id number]

## 2019-08-06 ENCOUNTER — Ambulatory Visit (INDEPENDENT_AMBULATORY_CARE_PROVIDER_SITE_OTHER): Payer: No Typology Code available for payment source | Admitting: Family Medicine

## 2019-08-06 ENCOUNTER — Other Ambulatory Visit: Payer: Self-pay

## 2019-08-06 ENCOUNTER — Encounter: Payer: Self-pay | Admitting: Family Medicine

## 2019-08-06 VITALS — BP 128/76 | HR 86 | Temp 98.2°F | Ht 61.0 in | Wt 121.4 lb

## 2019-08-06 DIAGNOSIS — Z0000 Encounter for general adult medical examination without abnormal findings: Secondary | ICD-10-CM | POA: Diagnosis not present

## 2019-08-06 DIAGNOSIS — E041 Nontoxic single thyroid nodule: Secondary | ICD-10-CM

## 2019-08-06 DIAGNOSIS — Z23 Encounter for immunization: Secondary | ICD-10-CM

## 2019-08-06 DIAGNOSIS — I10 Essential (primary) hypertension: Secondary | ICD-10-CM | POA: Diagnosis not present

## 2019-08-06 DIAGNOSIS — E782 Mixed hyperlipidemia: Secondary | ICD-10-CM

## 2019-08-06 NOTE — Progress Notes (Signed)
Patient: Shari Cook MRN: [id number] DOB: [birth year] PCP: Orma Flaming, MD     Subjective:  Chief Complaint  Patient presents with  . Annual Exam  . Hypertension  . Hyperlipidemia    HPI: The patient is a 59 y.o. female who presents today for annual exam. She denies any changes to past medical history. There have been no recent hospitalizations. They are following a well balanced diet and exercise plan. She does 15-20 min of yoga, and 2 daily walks.She also walks on the weekend. Weight has been stable. No complaints today.   Hypertension: Here for follow up of hypertension.  Currently on hctz 25mg  daily.  Takes medication as prescribed and denies any side effects. Exercise includes yoga, walking 2x/week and longer walks on the weekend. Weight has been stable. Denies any chest pain, headaches, shortness of breath, vision changes, swelling in lower extremities.   Hyperlipidemia The 10-year ASCVD risk score Mikey Bussing DC Jr., et al., 2013) is: 2.3%   Immunization History  Administered Date(s) Administered  . Influenza,inj,Quad PF,6+ Mos 11/19/2018  . PFIZER SARS-COV-2 Vaccination 05/07/2019, 06/03/2019  . Zoster Recombinat (Shingrix) 12/15/2017, 06/13/2018   Colonoscopy: 05/13/2013 Mammogram: 01/08/2019 Pap smear: 07/04/2017   Review of Systems  Constitutional: Negative for chills, fatigue and fever.  HENT: Negative for dental problem, ear pain, hearing loss and trouble swallowing.   Eyes: Negative for visual disturbance.  Respiratory: Negative for cough, chest tightness, shortness of breath and wheezing.   Cardiovascular: Negative for chest pain, palpitations and leg swelling.  Gastrointestinal: Negative for abdominal pain, blood in stool, diarrhea, nausea and vomiting.  Endocrine: Negative for cold intolerance, polydipsia, polyphagia and polyuria.  Genitourinary: Negative for dysuria and hematuria.  Musculoskeletal: Negative for arthralgias.  Skin: Negative for rash.   Neurological: Negative for dizziness, light-headedness and headaches.  Psychiatric/Behavioral: Negative for dysphoric mood and sleep disturbance. The patient is not nervous/anxious.     Allergies Patient is allergic to statins.  Past Medical History Patient  has a past medical history of Allergy, History of chicken pox, Hyperlipidemia, Hypertension, and Status post dilation and curettage.  Surgical History Patient  has a past surgical history that includes Tonsillectomy and Wisdom tooth extraction.  Family History Pateint's family history includes Arthritis in her mother; Cancer in her mother; Diabetes in her maternal grandmother and mother; Early death in her father; Heart attack in her father; Heart disease in her father; Hypertension in her father; Stroke in her son.  Social History Patient  reports that she has never smoked. She has never used smokeless tobacco. She reports current alcohol use. She reports that she does not use drugs.    Objective: Vitals:   08/06/19 1417  BP: 128/76  Pulse: 86  Temp: 98.2 F (36.8 C)  TempSrc: Temporal  SpO2: 99%  Weight: 121 lb 6.4 oz (55.1 kg)  Height: 5\' 1"  (1.549 m)    Body mass index is 22.94 kg/m.  Physical Exam Vitals reviewed.  Constitutional:      Appearance: Normal appearance. She is well-developed and normal weight.  HENT:     Head: Normocephalic and atraumatic.     Right Ear: Tympanic membrane, ear canal and external ear normal.     Left Ear: Tympanic membrane, ear canal and external ear normal.     Nose: Nose normal.  Eyes:     Extraocular Movements: Extraocular movements intact.     Conjunctiva/sclera: Conjunctivae normal.     Pupils: Pupils are equal, round, and reactive to light.  Neck:  Thyroid: No thyromegaly.     Vascular: No carotid bruit.     Comments: Palpable thyroid nodule on left lower lobe Cardiovascular:     Rate and Rhythm: Normal rate and regular rhythm.     Pulses: Normal pulses.     Heart  sounds: Normal heart sounds. No murmur heard.   Pulmonary:     Effort: Pulmonary effort is normal.     Breath sounds: Normal breath sounds.  Abdominal:     General: Abdomen is flat. Bowel sounds are normal. There is no distension.     Palpations: Abdomen is soft.     Tenderness: There is no abdominal tenderness.  Musculoskeletal:     Cervical back: Normal range of motion and neck supple.  Lymphadenopathy:     Cervical: No cervical adenopathy.  Skin:    General: Skin is warm and dry.     Capillary Refill: Capillary refill takes less than 2 seconds.     Findings: No rash.  Neurological:     General: No focal deficit present.     Mental Status: She is alert and oriented to person, place, and time.     Cranial Nerves: No cranial nerve deficit.     Coordination: Coordination normal.     Deep Tendon Reflexes: Reflexes normal.  Psychiatric:        Mood and Affect: Mood normal.        Behavior: Behavior normal.          Office Visit from 08/06/2019 in [place]  PHQ-2 Total Score 0      Assessment/plan: 1. Annual physical exam Hm reviewed and UTD. She will come back for fasting labs. Doing well and healthy. F/u in one year.  Patient counseling [x]    Nutrition: Stressed importance of moderation in sodium/caffeine intake, saturated fat and cholesterol, caloric balance, sufficient intake of fresh fruits, vegetables, fiber, calcium, iron, and 1 mg of folate supplement per day (for females capable of pregnancy).  [x]    Stressed the importance of regular exercise.   []    Substance Abuse: Discussed cessation/primary prevention of tobacco, alcohol, or other drug use; driving or other dangerous activities under the influence; availability of treatment for abuse.   [x]    Injury prevention: Discussed safety belts, safety helmets, smoke detector, smoking near bedding or upholstery.   []    Sexuality: Discussed sexually transmitted diseases, partner selection, use of  condoms, avoidance of unintended pregnancy  and contraceptive alternatives.  []    Dental health: Discussed importance of regular tooth brushing, flossing, and dental visits.  [x]    Health maintenance and immunizations reviewed. Please refer to Health maintenance section.     2. Benign essential HTN Blood pressure is to goal. Continue current anti-hypertensive medication per hpi. Refills not needed and routine lab work will be done. She will return fasting. Recommended routine exercise and healthy diet including DASH diet and mediterranean diet. Encouraged weight loss. F/u in 12 months.    - CBC with Differential/Platelet; Future - Comprehensive metabolic panel; Future - Microalbumin / creatinine urine ratio  3. Hyperlipidemia, mixed  - Lipid panel; Future  4. Thyroid nodule Has f/u with endocrine in the fall with ultrasound.  - TSH; Future - T4, free; Future  5. Need for Tdap vaccination - Tdap vaccine greater than or equal to 7yo IM  This visit occurred during the SARS-CoV-2 public health emergency.  Safety protocols were in place, including screening questions prior to the visit, additional usage of staff PPE, and  extensive cleaning of exam room while observing appropriate contact time as indicated for disinfecting solutions.      Return in about 1 year (around 08/05/2020) for annual/bp/cholesterol. Orma Flaming, MD [place]  08/06/2019

## 2019-08-06 NOTE — Patient Instructions (Signed)

## 2019-08-07 ENCOUNTER — Other Ambulatory Visit (INDEPENDENT_AMBULATORY_CARE_PROVIDER_SITE_OTHER): Payer: No Typology Code available for payment source

## 2019-08-07 DIAGNOSIS — I10 Essential (primary) hypertension: Secondary | ICD-10-CM

## 2019-08-07 DIAGNOSIS — E782 Mixed hyperlipidemia: Secondary | ICD-10-CM

## 2019-08-07 DIAGNOSIS — E041 Nontoxic single thyroid nodule: Secondary | ICD-10-CM | POA: Diagnosis not present

## 2019-08-07 LAB — CBC WITH DIFFERENTIAL/PLATELET
Basophils Absolute: 0.1 10*3/uL (ref 0.0–0.1)
Basophils Relative: 1.2 % (ref 0.0–3.0)
Eosinophils Absolute: 0.2 10*3/uL (ref 0.0–0.7)
Eosinophils Relative: 4.1 % (ref 0.0–5.0)
HCT: 40.7 % (ref 36.0–46.0)
Hemoglobin: 13.9 g/dL (ref 12.0–15.0)
Lymphocytes Relative: 28.6 % (ref 12.0–46.0)
Lymphs Abs: 1.5 10*3/uL (ref 0.7–4.0)
MCHC: 34.1 g/dL (ref 30.0–36.0)
MCV: 91.4 fl (ref 78.0–100.0)
Monocytes Absolute: 0.4 10*3/uL (ref 0.1–1.0)
Monocytes Relative: 7.9 % (ref 3.0–12.0)
Neutro Abs: 3.1 10*3/uL (ref 1.4–7.7)
Neutrophils Relative %: 58.2 % (ref 43.0–77.0)
Platelets: 267 10*3/uL (ref 150.0–400.0)
RBC: 4.46 Mil/uL (ref 3.87–5.11)
RDW: 12.5 % (ref 11.5–15.5)
WBC: 5.3 10*3/uL (ref 4.0–10.5)

## 2019-08-07 LAB — COMPREHENSIVE METABOLIC PANEL
ALT: 14 U/L (ref 0–35)
AST: 16 U/L (ref 0–37)
Albumin: 4.5 g/dL (ref 3.5–5.2)
Alkaline Phosphatase: 46 U/L (ref 39–117)
BUN: 16 mg/dL (ref 6–23)
CO2: 33 mEq/L — ABNORMAL HIGH (ref 19–32)
Calcium: 9.8 mg/dL (ref 8.4–10.5)
Chloride: 103 mEq/L (ref 96–112)
Creatinine, Ser: 0.73 mg/dL (ref 0.40–1.20)
GFR: 82.29 mL/min (ref >60.00)
Glucose, Bld: 87 mg/dL (ref 70–99)
Potassium: 3.8 mEq/L (ref 3.5–5.1)
Sodium: 141 mEq/L (ref 135–145)
Total Bilirubin: 1 mg/dL (ref 0.2–1.2)
Total Protein: 6.6 g/dL (ref 6.0–8.3)

## 2019-08-07 LAB — LIPID PANEL
Cholesterol: 258 mg/dL — ABNORMAL HIGH (ref 0–200)
HDL: 104 mg/dL (ref >39.00)
LDL Cholesterol: 139 mg/dL — ABNORMAL HIGH (ref 0–99)
NonHDL: 154.31
Total CHOL/HDL Ratio: 2
Triglycerides: 78 mg/dL (ref 0.0–149.0)
VLDL: 15.6 mg/dL (ref 0.0–40.0)

## 2019-08-07 LAB — MICROALBUMIN / CREATININE URINE RATIO
Creatinine,U: 16.5 mg/dL
Microalb Creat Ratio: 4.2 mg/g (ref 0.0–30.0)
Microalb, Ur: <0.7 mg/dL (ref 0.0–1.9)

## 2019-08-07 LAB — TSH: TSH: 2.46 u[IU]/mL (ref 0.35–4.50)

## 2019-08-07 LAB — T4, FREE: Free T4: 0.85 ng/dL (ref 0.60–1.60)

## 2019-08-10 ENCOUNTER — Encounter: Payer: Self-pay | Admitting: Internal Medicine

## 2019-10-21 ENCOUNTER — Encounter: Payer: Self-pay | Admitting: Family Medicine

## 2019-11-27 ENCOUNTER — Encounter: Payer: Self-pay | Admitting: Family Medicine

## 2019-11-30 ENCOUNTER — Encounter: Payer: Self-pay | Admitting: Family Medicine

## 2019-11-30 ENCOUNTER — Other Ambulatory Visit: Payer: Self-pay

## 2019-11-30 ENCOUNTER — Ambulatory Visit: Payer: No Typology Code available for payment source | Admitting: Family Medicine

## 2019-11-30 VITALS — BP 133/83 | HR 81 | Temp 98.2°F | Ht 61.0 in | Wt 122.4 lb

## 2019-11-30 DIAGNOSIS — R0982 Postnasal drip: Secondary | ICD-10-CM | POA: Diagnosis not present

## 2019-11-30 DIAGNOSIS — Z23 Encounter for immunization: Secondary | ICD-10-CM | POA: Diagnosis not present

## 2019-11-30 MED ORDER — AZELASTINE HCL 0.1 % NA SOLN: 1.0000 | Freq: Every morning | NASAL | 1 refills | Status: DC

## 2019-11-30 NOTE — Progress Notes (Signed)
Patient: Shari Cook MRN: [id number] DOB: [birth year] PCP: Orma Flaming, MD     Subjective:  Chief Complaint  Patient presents with  . Sore Throat  . Nasal Congestion    HPI: The patient is a 59 y.o. female who presents today for on and off sore throat and congestion for 1 month that has been intermittent but never has gone away. It is worse at night. She feels like she could live on throat lozenges. It has prevented her from doing things and feels fine, it's just been going on over a month. She has taken OTC zyrtec. No fever/chills. She doesn't have sinus pain, maybe pressure? She doesn't really have pain in her teeth, but does have a sensation around her gums. No ear pain or pressure. No real, copious thick nasal discharge. She has not used any nasal spray.   Review of Systems  Constitutional: Negative for chills and fever.  HENT: Positive for congestion and sore throat. Negative for sinus pressure, sinus pain and trouble swallowing.   Respiratory: Negative for shortness of breath.   Cardiovascular: Negative for chest pain and palpitations.  Neurological: Negative for dizziness and headaches.    Allergies Patient is allergic to statins.  Past Medical History Patient  has a past medical history of Allergy, History of chicken pox, Hyperlipidemia, Hypertension, and Status post dilation and curettage.  Surgical History Patient  has a past surgical history that includes Tonsillectomy and Wisdom tooth extraction.  Family History Pateint's family history includes Arthritis in her mother; Cancer in her mother; Diabetes in her maternal grandmother and mother; Early death in her father; Heart attack in her father; Heart disease in her father; Hypertension in her father; Stroke in her son.  Social History Patient  reports that she has never smoked. She has never used smokeless tobacco. She reports current alcohol use. She reports that she does not use drugs.     Objective: Vitals:   11/30/19 1304  BP: 133/83  Pulse: 81  Temp: 98.2 F (36.8 C)  TempSrc: Temporal  SpO2: 99%  Weight: 122 lb 6.4 oz (55.5 kg)  Height: 5\' 1"  (1.549 m)    Body mass index is 23.13 kg/m.  Physical Exam Vitals reviewed.  Constitutional:      Appearance: She is well-developed and normal weight.  HENT:     Head: Normocephalic and atraumatic.     Comments: No ttp over sinuses    Right Ear: Tympanic membrane and ear canal normal.     Left Ear: Tympanic membrane and ear canal normal.     Mouth/Throat:     Pharynx: Posterior oropharyngeal erythema present. No oropharyngeal exudate.     Comments: +cobblestoning on posterior pharynx  Neck:     Comments: Small goiter on left lower lobe of thyroid  Cardiovascular:     Rate and Rhythm: Normal rate and regular rhythm.     Heart sounds: Normal heart sounds.  Pulmonary:     Effort: Pulmonary effort is normal.     Breath sounds: Normal breath sounds.  Abdominal:     General: Bowel sounds are normal.     Palpations: Abdomen is soft.  Musculoskeletal:     Cervical back: Normal range of motion and neck supple.  Skin:    General: Skin is warm.     Capillary Refill: Capillary refill takes less than 2 seconds.  Neurological:     General: No focal deficit present.     Mental Status: She is alert and oriented to  person, place, and time.  Psychiatric:        Mood and Affect: Mood normal.        Behavior: Behavior normal.        Assessment/plan: 1. Post-nasal drip Trial of flonase at night. If needed can add on asteline in the AM. Cool mist humidifier, warm salt water gurgles. If not getting better she is to let me know.   2. Flu shot today   This visit occurred during the SARS-CoV-2 public health emergency.  Safety protocols were in place, including screening questions prior to the visit, additional usage of staff PPE, and extensive cleaning of exam room while observing appropriate contact time as indicated for  disinfecting solutions.     Return if symptoms worsen or fail to improve.   Orma Flaming, MD [place]   11/30/2019

## 2019-11-30 NOTE — Patient Instructions (Addendum)
-  cool mist humidifier at night -trial of flonase at night. Sending in another spray to use in am if needed. Will use this in the AM  Postnasal Drip Postnasal drip is the feeling of mucus going down the back of your throat. Mucus is a slimy substance that moistens and cleans your nose and throat, as well as the air pockets in face bones near your forehead and cheeks (sinuses). Small amounts of mucus pass from your nose and sinuses down the back of your throat all the time. This is normal. When you produce too much mucus or the mucus gets too thick, you can feel it. Some common causes of postnasal drip include:  Having more mucus because of: ? A cold or the flu. ? Allergies. ? Cold air. ? Certain medicines.  Having more mucus that is thicker because of: ? A sinus or nasal infection. ? Dry air. ? A food allergy. Follow these instructions at home: Relieving discomfort   Gargle with a salt-water mixture 3-4 times a day or as needed. To make a salt-water mixture, completely dissolve -1 tsp of salt in 1 cup of warm water.  If the air in your home is dry, use a humidifier to add moisture to the air.  Use a saline spray or container (neti pot) to flush out the nose (nasal irrigation). These methods can help clear away mucus and keep the nasal passages moist. General instructions  Take over-the-counter and prescription medicines only as told by your health care provider.  Follow instructions from your health care provider about eating or drinking restrictions. You may need to avoid caffeine.  Avoid things that you know you are allergic to (allergens), like dust, mold, pollen, pets, or certain foods.  Drink enough fluid to keep your urine pale yellow.  Keep all follow-up visits as told by your health care provider. This is important. Contact a health care provider if:  You have a fever.  You have a sore throat.  You have difficulty swallowing.  You have headache.  You have sinus  pain.  You have a cough that does not go away.  The mucus from your nose becomes thick and is green or yellow in color.  You have cold or flu symptoms that last more than 10 days. Summary  Postnasal drip is the feeling of mucus going down the back of your throat.  If your health care provider approves, use nasal irrigation or a nasal spray 2?4 times a day.  Avoid things that you know you are allergic to (allergens), like dust, mold, pollen, pets, or certain foods. This information is not intended to replace advice given to you by your health care provider. Make sure you discuss any questions you have with your health care provider. Document Revised: 05/30/2018 Document Reviewed: 05/21/2016 Elsevier Patient Education  [place].

## 2019-12-01 ENCOUNTER — Encounter: Payer: Self-pay | Admitting: Family Medicine

## 2019-12-15 ENCOUNTER — Other Ambulatory Visit: Payer: Self-pay | Admitting: Family Medicine

## 2019-12-15 DIAGNOSIS — Z1231 Encounter for screening mammogram for malignant neoplasm of breast: Secondary | ICD-10-CM

## 2019-12-18 MED ORDER — MONTELUKAST SODIUM 10 MG PO TABS: 10.0000 mg | ORAL_TABLET | Freq: Every day | ORAL | 1 refills | Status: DC

## 2019-12-18 NOTE — Addendum Note (Signed)
Addended by: Orma Flaming on: 12/18/2019 03:37 PM   Modules accepted: Orders

## 2019-12-21 ENCOUNTER — Other Ambulatory Visit: Payer: Self-pay

## 2019-12-21 DIAGNOSIS — R0982 Postnasal drip: Secondary | ICD-10-CM

## 2019-12-21 DIAGNOSIS — J309 Allergic rhinitis, unspecified: Secondary | ICD-10-CM

## 2019-12-21 NOTE — Addendum Note (Signed)
Addended by: Orma Flaming on: 12/21/2019 04:28 PM   Modules accepted: Orders

## 2019-12-22 ENCOUNTER — Other Ambulatory Visit: Payer: Self-pay | Admitting: Family Medicine

## 2019-12-29 ENCOUNTER — Other Ambulatory Visit: Payer: Self-pay | Admitting: Family Medicine

## 2019-12-30 ENCOUNTER — Ambulatory Visit: Payer: BC Managed Care – PPO | Admitting: Internal Medicine

## 2020-01-01 ENCOUNTER — Encounter: Payer: Self-pay | Admitting: Internal Medicine

## 2020-01-01 ENCOUNTER — Other Ambulatory Visit: Payer: Self-pay

## 2020-01-01 ENCOUNTER — Ambulatory Visit (INDEPENDENT_AMBULATORY_CARE_PROVIDER_SITE_OTHER): Payer: No Typology Code available for payment source | Admitting: Internal Medicine

## 2020-01-01 VITALS — BP 134/84 | HR 88 | Ht 61.0 in | Wt 124.0 lb

## 2020-01-01 DIAGNOSIS — E042 Nontoxic multinodular goiter: Secondary | ICD-10-CM

## 2020-01-01 NOTE — Progress Notes (Signed)
Name: Shari Cook  MRN/ DOB: [id number], 01-Feb-1961    Age/ Sex: 59 y.o., female     PCP: Orma Flaming, MD   Reason for Endocrinology Evaluation:  Multinodular goiter     Initial Endocrinology Clinic Visit: 12/30/2018    PATIENT IDENTIFIER: [name] is a 59 y.o., female with a past medical history of multinodular goiter and hypertension. She has followed with [facility] since 12/30/2018 for consultative assistance with management of her multinodular goiter  HISTORICAL SUMMARY: Pt was noted to have thyromegaly during routine examination at her dentist's office, she was advised to follow up with PCP which prompted an ultrasound in 12/2018, she is status post FNA of the left mid nodule with benign cytology on 01/20/2019.  No radiation exposure  Denies depression and anxiety   No Fh of thyroid disease   SUBJECTIVE:    Today (01/01/2020):  Shari Cook is here for multinodular goiter.  Weight has been stable   Denies any neck swelling,  pain, or dysphagia   Denies palpitations or diarrhea  Denies tremors     HISTORY:  Past Medical History:  Past Medical History:  Diagnosis Date  . Allergy   . History of chicken pox   . Hyperlipidemia   . Hypertension   . Status post dilation and curettage    Past Surgical History:  Past Surgical History:  Procedure Laterality Date  . TONSILLECTOMY    . WISDOM TOOTH EXTRACTION      Social History:  reports that she has never smoked. She has never used smokeless tobacco. She reports current alcohol use. She reports that she does not use drugs. Family History:  Family History  Problem Relation Age of Onset  . Arthritis Mother   . Cancer Mother   . Diabetes Mother   . Early death Father   . Heart disease Father   . Hypertension Father   . Heart attack Father   . Stroke Son   . Diabetes Maternal Grandmother      HOME MEDICATIONS: Allergies as of 01/01/2020      Reactions    Statins Other (See Comments)   Muscle pain w/statin drugs      Medication List       Accurate as of January 01, 2020  1:12 PM. If you have any questions, ask your nurse or doctor.        azelastine 0.1 % nasal spray Commonly known as: ASTELIN PLACE 1 SPRAY INTO BOTH NOSTRILS IN THE MORNING. USE IN EACH NOSTRIL AS DIRECTED   CALCIUM MAGNESIUM PO Take by mouth.   clotrimazole-betamethasone lotion Commonly known as: LOTRISONE Apply topically 2 (two) times daily.   CVS SPECTRAVITE ADULT 50+ PO Take by mouth.   FISH OIL PO Take by mouth.   Garlic 497 MG Tabs Take by mouth.   hydrochlorothiazide 25 MG tablet Commonly known as: HYDRODIURIL TAKE 1 TABLET DAILY   ketoconazole 2 % shampoo Commonly known as: NIZORAL Use twice a week   LUTEIN 20 PO Take 1 tablet by mouth daily.   montelukast 10 MG tablet Commonly known as: SINGULAIR Take 1 tablet (10 mg total) by mouth at bedtime.   Voltaren 1 % Gel Generic drug: diclofenac sodium Voltaren 1 % topical gel   ZYRTEC ALLERGY PO         OBJECTIVE:   PHYSICAL EXAM: VS: BP 134/84   Pulse 88   Ht 5\' 1"  (1.549 m)   Wt 124 lb (56.2 kg)  LMP  (LMP Unknown)   SpO2 98%   BMI 23.43 kg/m    EXAM: General: Pt appears well and is in NAD  Neck: General: Supple without adenopathy. Thyroid: Left thyroid  nodule appreciated  Lungs: Clear with good BS bilat with no rales, rhonchi, or wheezes  Heart: Auscultation: RRR.  Abdomen: Normoactive bowel sounds, soft, nontender, without masses or organomegaly palpable  Extremities:  BL LE: No pretibial edema normal ROM and strength.  Mental Status: Judgment, insight: Intact Orientation: Oriented to time, place, and person Mood and affect: No depression, anxiety, or agitation     DATA REVIEWED:  Results for [facility], Alanya B "PAM" (MRN [id number]) as of 01/01/2020 12:43  Ref. Range 08/07/2019 10:06  TSH Latest Ref Range: 0.35 - 4.50 uIU/mL 2.46  T4,Free(Direct) Latest  Ref Range: 0.60 - 1.60 ng/dL 0.85      FNA 01/20/2019 Clinical History: Left mid 2.7cm; Other 2 dimensions: 2 x 1.5 cm, Mixed  cystic and solid, Hypoechoic, TI-RADS total points 6  Specimen Submitted: A. THYROID, LMP, FINE NEEDLE ASPIRATION:    FINAL MICROSCOPIC DIAGNOSIS:  - Consistent with benign follicular nodule (Bethesda category II)   ASSESSMENT / PLAN / RECOMMENDATIONS:   1. Multinodular goiter:   - She is clinically and biochemically euthyroid  - No local neck symptoms - S/P Benign FNA of the left mid nodule 01/2019 - Will repeat thyroid ultrasound for stability    F/U in 1 yr  Signed electronically by: Mack Guise, MD  [facility]  [address] [place], [place] [address] Phone: [phone] FAX: [phone]      CC: Orma Flaming, [place] [address] Phone: [phone]  Fax: [phone]   Return to Endocrinology clinic as below: Future Appointments  Date Time Provider [place]  01/20/2020  2:10 PM [facility] MM 2 [facility]  02/09/2020  2:00 PM Valentina Shaggy, MD [facility] None  05/12/2020  1:30 PM Salvadore Dom, MD [place] None

## 2020-01-18 ENCOUNTER — Ambulatory Visit
Admission: RE | Admit: 2020-01-18 | Discharge: 2020-01-18 | Disposition: A | Discharge status: Home / Self Care | Payer: No Typology Code available for payment source | Source: Ambulatory Visit | Attending: Internal Medicine | Admitting: Internal Medicine

## 2020-01-18 DIAGNOSIS — E042 Nontoxic multinodular goiter: Secondary | ICD-10-CM

## 2020-01-20 ENCOUNTER — Ambulatory Visit
Admission: RE | Admit: 2020-01-20 | Discharge: 2020-01-20 | Disposition: A | Discharge status: Home / Self Care | Payer: No Typology Code available for payment source | Source: Ambulatory Visit | Attending: Family Medicine | Admitting: Family Medicine

## 2020-01-20 ENCOUNTER — Other Ambulatory Visit: Payer: Self-pay

## 2020-01-20 DIAGNOSIS — Z1231 Encounter for screening mammogram for malignant neoplasm of breast: Secondary | ICD-10-CM

## 2020-02-09 ENCOUNTER — Other Ambulatory Visit: Payer: Self-pay

## 2020-02-09 ENCOUNTER — Encounter: Payer: Self-pay | Admitting: Allergy & Immunology

## 2020-02-09 ENCOUNTER — Ambulatory Visit: Payer: No Typology Code available for payment source | Admitting: Allergy & Immunology

## 2020-02-09 VITALS — BP 118/70 | HR 80 | Temp 98.1°F | Resp 16 | Ht 60.5 in | Wt 124.0 lb

## 2020-02-09 DIAGNOSIS — J029 Acute pharyngitis, unspecified: Secondary | ICD-10-CM

## 2020-02-09 DIAGNOSIS — J310 Chronic rhinitis: Secondary | ICD-10-CM | POA: Diagnosis not present

## 2020-02-09 NOTE — Progress Notes (Signed)
NEW PATIENT  Date of Service/Encounter:  02/09/20  Referring provider: Orma Flaming, MD   Assessment:   Pharyngitis   Chronic rhinitis    Plan/Recommendations:   1. Pharyngitis - Testing to the entire environmental panel was negative. - Results provided. - Stop the Singulair, Zyrtec, and the nose sprays. - Add both the nose sprays up to twice daily for two weeks when you have these symptoms in the future to see if it helps at all. - We can do more invasive testing in the future if needed (intradermal testing with needles). - Let's regroup in 6 months.   2. Return in about 6 months (around 08/09/2020).   Subjective:   Shari Cook is a 59 y.o. female presenting today for evaluation of  Chief Complaint  Patient presents with   Allergic Rhinitis     Sore throat that lasted for over a month. Has moved several times. Moved to [place] just before COVID. Was unable to get in with new PCP due to covid. Advised to take antihistamine to see if it helped. It did some. Sore throat persisted still. Used a nasal spray with no benefit.     Shari Cook has a history of the following: Patient Active Problem List   Diagnosis Date Noted   Thyroid nodule 11/26/2018   Family history of early CAD 07/03/2018   Benign essential HTN 06/13/2018   Hyperlipidemia, mixed 06/13/2018    History obtained from: chart review and patient.  Shari Cook was referred by Orma Flaming, MD.     Shari Cook is a 59 y.o. female presenting for an evaluation of a sore throat.  She moved here right before COVID19. She was started on an antihistamine that helped somewhat. A nose sprays was started which did not help at all. Sore throat persisted. She has had tonsils and adenoids removed as well as her wisdom teeth.   She had done a COVID test within 14 days of one of the first appointments. She did not get swabbed for Strep. She was going to be prescribed an inhaler, but she was instead  started on Flonase in the morning and Astelin at night. She was then started on Singulair. After a couple of weeks, her symptoms went away.   There were two other occasions where she was diagnosed with sinusitis. This was in [place] around five years ago. She moved here from [place]. Prior to that, she was in [place], [place]. She was in [place] before that and originally from [place].  She was tested for allergies as a child, under the age of 88. She was on allergy shots for years. She was allergic to dust, mold, and chocolate. She does not remember whether allergy shots helped at all. She did have a cousin who was in the medical field and she would come over and giver her the shots.   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   Otherwise, there is no history of other atopic diseases, including asthma, food allergies, drug allergies, stinging insect allergies, eczema, urticaria or contact dermatitis. There is no significant infectious history. Vaccinations are up to date.    Past Medical History: Patient Active Problem List   Diagnosis Date Noted   Thyroid nodule 11/26/2018   Family history of early CAD 07/03/2018   Benign essential HTN 06/13/2018   Hyperlipidemia, mixed 06/13/2018    Medication List:  Allergies as of 02/09/2020      Reactions  Statins Other (See Comments)   Muscle pain w/statin drugs      Medication List       Accurate as of February 09, 2020  6:31 PM. If you have any questions, ask your nurse or doctor.        azelastine 0.1 % nasal spray Commonly known as: ASTELIN PLACE 1 SPRAY INTO BOTH NOSTRILS IN THE MORNING. USE IN EACH NOSTRIL AS DIRECTED   CALCIUM MAGNESIUM PO Take by mouth.   clotrimazole-betamethasone lotion Commonly known as: LOTRISONE Apply topically 2 (two) times daily.   CVS SPECTRAVITE ADULT 50+ PO Take by mouth.   diclofenac sodium 1 % Gel Commonly known as:  VOLTAREN Voltaren 1 % topical gel   FISH OIL PO Take by mouth.   Garlic 200 MG Tabs Take by mouth.   hydrochlorothiazide 25 MG tablet Commonly known as: HYDRODIURIL TAKE 1 TABLET DAILY   ketoconazole 2 % shampoo Commonly known as: NIZORAL Use twice a week   LUTEIN 20 PO Take 1 tablet by mouth daily.   montelukast 10 MG tablet Commonly known as: SINGULAIR Take 1 tablet (10 mg total) by mouth at bedtime.   ZYRTEC ALLERGY PO       Birth History: non-contributory  Developmental History: non-contributory  Past Surgical History: Past Surgical History:  Procedure Laterality Date   ADENOIDECTOMY     DILATION AND CURETTAGE OF UTERUS     TONSILLECTOMY     WISDOM TOOTH EXTRACTION       Family History: Family History  Problem Relation Age of Onset   Arthritis Mother    Cancer Mother    Diabetes Mother    Early death Father    Heart disease Father    Hypertension Father    Heart attack Father    Stroke Son    Diabetes Maternal Grandmother      Social History: Shari Cook lives at home with her family. She lives in a house that is 57 years old. There is laminate and rugs in the main living areas and wall-to-wall carpeting in the bedrooms.  They have gas heating and central cooling.  She works for a Surveyor, [employer].  She is not exposed to fumes, chemicals, or dust.  She does not use a HEPA filter in the home.  She does not live near an interstate or industrial area.  Here is 1 cat inside of the home.  She is unsure if there are dust mite covers on the bedding.  There is no tobacco exposure.  She currently works for   Review of Systems  Constitutional: Negative.  Negative for fever, malaise/fatigue and weight loss.  HENT: Negative.  Negative for congestion, ear discharge and ear pain.        Positive for pharyngitis.  Eyes: Negative for pain, discharge and redness.  Respiratory: Negative for cough, sputum production, shortness of breath and  wheezing.   Cardiovascular: Negative.  Negative for chest pain and palpitations.  Gastrointestinal: Negative for abdominal pain, constipation, diarrhea, heartburn, nausea and vomiting.  Skin: Negative.  Negative for itching and rash.  Neurological: Negative for dizziness and headaches.  Endo/Heme/Allergies: Negative for environmental allergies. Does not bruise/bleed easily.       Objective:   Blood pressure 118/70, pulse 80, temperature 98.1 F (36.7 C), temperature source Temporal, resp. rate 16, height 5' 0.5" (1.537 m), weight 124 lb (56.2 kg), SpO2 98 %. Body mass index is 23.82 kg/m.   Physical Exam:   Physical Exam Constitutional:  Appearance: She is well-developed.     Comments: Pleasant female.   HENT:     Head: Normocephalic and atraumatic.     Right Ear: Tympanic membrane, ear canal and external ear normal. No drainage, swelling or tenderness. Tympanic membrane is not injected, scarred, erythematous, retracted or bulging.     Left Ear: Tympanic membrane, ear canal and external ear normal. No drainage, swelling or tenderness. Tympanic membrane is not injected, scarred, erythematous, retracted or bulging.     Nose: No nasal deformity, septal deviation, mucosal edema, rhinorrhea or epistaxis.     Right Turbinates: Enlarged and swollen.     Left Turbinates: Enlarged and swollen.     Right Sinus: No maxillary sinus tenderness or frontal sinus tenderness.     Left Sinus: No maxillary sinus tenderness or frontal sinus tenderness.     Mouth/Throat:     Mouth: Oropharynx is clear and moist. Mucous membranes are not pale and not dry.     Pharynx: Uvula midline.  Eyes:     General:        Right eye: No discharge.        Left eye: No discharge.     Extraocular Movements: EOM normal.     Conjunctiva/sclera: Conjunctivae normal.     Right eye: Right conjunctiva is not injected. No chemosis.    Left eye: Left conjunctiva is not injected. No chemosis.    Pupils: Pupils are  equal, round, and reactive to light.  Cardiovascular:     Rate and Rhythm: Normal rate and regular rhythm.     Heart sounds: Normal heart sounds.  Pulmonary:     Effort: Pulmonary effort is normal. No tachypnea, accessory muscle usage or respiratory distress.     Breath sounds: Normal breath sounds. No wheezing, rhonchi or rales.     Comments: Moving air well in all lung fields. No increased work of breathing noted. Chest:     Chest wall: No tenderness.  Abdominal:     Tenderness: There is no abdominal tenderness. There is no guarding or rebound.  Lymphadenopathy:     Head:     Right side of head: No submandibular, tonsillar or occipital adenopathy.     Left side of head: No submandibular, tonsillar or occipital adenopathy.     Cervical: No cervical adenopathy.  Skin:    Coloration: Skin is not pale.     Findings: No abrasion, erythema, petechiae or rash. Rash is not papular, urticarial or vesicular.     Comments: No eczematous or urticarial lesions noted.   Neurological:     Mental Status: She is alert.  Psychiatric:        Mood and Affect: Mood and affect normal.        Behavior: Behavior is cooperative.      Diagnostic studies:    Allergy Studies:     Airborne Adult Perc - 02/09/20 1400    Time Antigen Placed 0240    Allergen Manufacturer Lavella Hammock    Location Back    Number of Test 59    1. Control-Buffer 50% Glycerol Negative    2. Control-Histamine 1 mg/ml 2+    3. Albumin saline Negative    4. Segundo Negative    5. Guatemala Negative    6. Johnson Negative    7. Oden Blue Negative    8. Meadow Fescue Negative    9. Perennial Rye Negative    10. Sweet Vernal Negative    11. Timothy Negative  12. Cocklebur Negative    13. Burweed Marshelder Negative    14. Ragweed, short Negative    15. Ragweed, Giant Negative    16. Plantain,  English Negative    17. Lamb's Quarters Negative    18. Sheep Sorrell Negative    19. Rough Pigweed Negative    20. Marsh Elder,  Rough Negative    21. Mugwort, Common Negative    22. Ash mix Negative    23. Birch mix Negative    24. Beech American Negative    25. Box, Elder Negative    26. Cedar, red Negative    27. Cottonwood, Guinea-Bissau Negative    28. Elm mix Negative    29. Hickory Negative    30. Maple mix Negative    31. Oak, Guinea-Bissau mix Negative    32. Pecan Pollen Negative    33. Pine mix Negative    34. Sycamore Eastern Negative    35. Walnut, Black Pollen Negative    36. Alternaria alternata Negative    37. Cladosporium Herbarum Negative    38. Aspergillus mix Negative    39. Penicillium mix Negative    40. Bipolaris sorokiniana (Helminthosporium) Negative    41. Drechslera spicifera (Curvularia) Negative    42. Mucor plumbeus Negative    43. Fusarium moniliforme Negative    44. Aureobasidium pullulans (pullulara) Negative    45. Rhizopus oryzae Negative    46. Botrytis cinera Negative    47. Epicoccum nigrum Negative    48. Phoma betae Negative    49. Candida Albicans Negative    50. Trichophyton mentagrophytes Negative    51. Mite, D Farinae  5,000 AU/ml Negative    52. Mite, D Pteronyssinus  5,000 AU/ml Negative    53. Cat Hair 10,000 BAU/ml Negative    54.  Dog Epithelia Negative    55. Mixed Feathers Negative    56. Horse Epithelia Negative    57. Cockroach, German Negative    58. Mouse Negative    59. Tobacco Leaf Negative           Allergy testing results were read and interpreted by myself, documented by clinical staff.         Malachi Bonds, MD [facility]

## 2020-02-09 NOTE — Patient Instructions (Addendum)
1. Pharyngitis - Testing to the entire environmental panel was negative. - Results provided. - Stop the Singulair, Zyrtec, and the nose sprays. - Add both the nose sprays up to twice daily for two weeks when you have these symptoms in the future to see if it helps at all. - We can do more invasive testing in the future if needed (intradermal testing with needles). - Let's regroup in 6 months.   2. Return in about 6 months (around 08/09/2020).    Please inform us of any Emergency Department visits, hospitalizations, or changes in symptoms. Call us before going to the ED for breathing or allergy symptoms since we might be able to fit you in for a sick visit. Feel free to contact us anytime with any questions, problems, or concerns.  It was a pleasure to meet you today!  Websites that have reliable patient information: 1. American Academy of Asthma, Allergy, and Immunology: www.aaaai.org 2. Food Allergy Research and Education (FARE): foodallergy.org 3. Mothers of Asthmatics: http://www.asthmacommunitynetwork.org 4. American College of Allergy, Asthma, and Immunology: www.acaai.org   COVID-19 Vaccine Information can be found at: ShippingScam.co.uk For questions related to vaccine distribution or appointments, please email [email or web address] or call [phone].     "Like" [place] on Facebook and Instagram for our latest updates!       Make sure you are registered to vote! If you have moved or changed any of your contact information, you will need to get this updated before voting!  In some cases, you MAY be able to register to vote online: [email or web address]

## 2020-02-11 ENCOUNTER — Encounter: Payer: Self-pay | Admitting: Allergy & Immunology

## 2020-03-19 ENCOUNTER — Other Ambulatory Visit: Payer: Self-pay | Admitting: Family Medicine

## 2020-03-21 ENCOUNTER — Encounter: Payer: Self-pay | Admitting: Family Medicine

## 2020-05-12 ENCOUNTER — Ambulatory Visit (INDEPENDENT_AMBULATORY_CARE_PROVIDER_SITE_OTHER): Payer: No Typology Code available for payment source | Admitting: Obstetrics and Gynecology

## 2020-05-12 ENCOUNTER — Other Ambulatory Visit: Payer: Self-pay

## 2020-05-12 ENCOUNTER — Encounter: Payer: Self-pay | Admitting: Obstetrics and Gynecology

## 2020-05-12 VITALS — BP 120/62 | HR 66 | Resp 16 | Ht 61.0 in | Wt 124.0 lb

## 2020-05-12 DIAGNOSIS — Z01419 Encounter for gynecological examination (general) (routine) without abnormal findings: Secondary | ICD-10-CM | POA: Diagnosis not present

## 2020-05-12 NOTE — Progress Notes (Signed)
60 y.o. G2P0 Married White or Caucasian Not Hispanic or Latino female here for annual exam.  No vaginal bleeding. Not sexually active.   No bowel or bladder c/o.  Being followed for thyroid nodules. Stable    No LMP recorded (lmp unknown). Patient is postmenopausal.          Sexually active: No.  The current method of family planning is post menopausal status.    Exercising: No.  Walking and stretching Smoker:  no  Health Maintenance: Pap: 07/04/17 negative, negative HRHPV   History of abnormal Pap:  no MMG:  01/21/20 density C Bi-rads 1 neg  BMD:   None  Colonoscopy: 2015, f/u 10 years. TDaP:  08/06/19 Gardasil: none    reports that she has never smoked. She has never used smokeless tobacco. She reports current alcohol use. She reports that she does not use drugs. Just occasional ETOH. 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 2 grown kids. Granddaughter is 64.63 years old.   Past Medical History:  Diagnosis Date  . Allergy   . History of chicken pox   . Hyperlipidemia   . Hypertension   . Status post dilation and curettage     Past Surgical History:  Procedure Laterality Date  . ADENOIDECTOMY    . DILATION AND CURETTAGE OF UTERUS    . TONSILLECTOMY    . WISDOM TOOTH EXTRACTION      Current Outpatient Medications  Medication Sig Dispense Refill  . azelastine (ASTELIN) 0.1 % nasal spray PLACE 1 SPRAY INTO BOTH NOSTRILS IN THE MORNING. USE IN EACH NOSTRIL AS DIRECTED 90 mL 1  . Calcium-Magnesium-Vitamin D (CALCIUM MAGNESIUM PO) Take by mouth.    . Cetirizine HCl (ZYRTEC ALLERGY PO)     . clotrimazole-betamethasone (LOTRISONE) lotion APPLY TOPICALLY TWO TIMES ADAY 30 mL 1  . diclofenac sodium (VOLTAREN) 1 % GEL Voltaren 1 % topical gel    . Garlic 433 MG TABS Take by mouth.    . hydrochlorothiazide (HYDRODIURIL) 25 MG tablet TAKE 1 TABLET DAILY 90 tablet 3  . ketoconazole (NIZORAL) 2 % shampoo Use twice a week 120 mL 3  . Misc  Natural Products (LUTEIN 20 PO) Take 1 tablet by mouth daily.     . montelukast (SINGULAIR) 10 MG tablet Take 1 tablet (10 mg total) by mouth at bedtime. 90 tablet 1  . Multiple Vitamins-Minerals (CVS SPECTRAVITE ADULT 50+ PO) Take by mouth.    . Omega-3 Fatty Acids (FISH OIL PO) Take by mouth.     No current facility-administered medications for this visit.    Family History  Problem Relation Age of Onset  . Arthritis Mother   . Cancer Mother   . Diabetes Mother   . Early death Father   . Heart disease Father   . Hypertension Father   . Heart attack Father   . Stroke Son   . Diabetes Maternal Grandmother   Son is okay  Review of Systems  All other systems reviewed and are negative.   Exam:   BP 120/62   Pulse 66   Resp 16   Ht 5\' 1"  (1.549 m)   Wt 124 lb (56.2 kg)   LMP  (LMP Unknown)   BMI 23.43 kg/m   Weight change: @WEIGHTCHANGE @ Height:   Height: 5\' 1"  (154.9 cm)  Ht Readings from Last 3 Encounters:  05/12/20 5\' 1"  (1.549 m)  02/09/20 5' 0.5" (1.537 m)  01/01/20 5\' 1"  (1.549  m)    General appearance: alert, cooperative and appears stated age Head: Normocephalic, without obvious abnormality, atraumatic Neck: no adenopathy, supple, symmetrical, trachea midline and thyroid normal to inspection and palpation Lungs: clear to auscultation bilaterally Cardiovascular: regular rate and rhythm Breasts: normal appearance, no masses or tenderness Abdomen: soft, non-tender; non distended,  no masses,  no organomegaly Extremities: extremities normal, atraumatic, no cyanosis or edema Skin: Skin color, texture, turgor normal. No rashes or lesions Lymph nodes: Cervical, supraclavicular, and axillary nodes normal. No abnormal inguinal nodes palpated Neurologic: Grossly normal   Pelvic: External genitalia:  no lesions              Urethra:  normal appearing urethra with no masses, tenderness or lesions              Bartholins and Skenes: normal                 Vagina:  atrophic appearing vagina with normal color and discharge, no lesions              Cervix: no lesions               Bimanual Exam:  Uterus:  normal size, contour, position, consistency, mobility, non-tender              Adnexa: no mass, fullness, tenderness               Rectovaginal: Confirms               Anus:  normal sphincter tone, no lesions  Wandra Scot chaperoned for the exam.  1. Well woman exam Discussed breast self exam Discussed calcium and vit D intake Mammogram and colonoscopy UTD Labs with primary No pap this year

## 2020-05-12 NOTE — Patient Instructions (Signed)

## 2020-05-13 ENCOUNTER — Other Ambulatory Visit: Payer: Self-pay | Admitting: Family Medicine

## 2020-05-13 NOTE — Telephone Encounter (Signed)
Please schedule an appointment with with [facility] for further refills.

## 2020-05-16 ENCOUNTER — Encounter: Payer: Self-pay | Admitting: Family Medicine

## 2020-05-17 ENCOUNTER — Encounter: Payer: Self-pay | Admitting: Physician Assistant

## 2020-05-17 ENCOUNTER — Other Ambulatory Visit: Payer: Self-pay

## 2020-05-17 ENCOUNTER — Ambulatory Visit: Payer: No Typology Code available for payment source | Admitting: Physician Assistant

## 2020-05-17 VITALS — BP 108/72 | HR 81 | Temp 98.2°F | Ht 61.0 in | Wt 124.2 lb

## 2020-05-17 DIAGNOSIS — S61219A Laceration without foreign body of unspecified finger without damage to nail, initial encounter: Secondary | ICD-10-CM | POA: Diagnosis not present

## 2020-05-17 NOTE — Progress Notes (Signed)
Acute Office Visit  Subjective:    Patient ID: Shari Cook, female    DOB: Jul 02, 1960, 60 y.o.   MRN: [id number]  Chief Complaint  Patient presents with  . Abdominal Pain  . Laceration    HPI Patient is in today for tiny laceration on her R middle finger. DOI was 05/15/20 in the afternoon. Pt states she was taking recycling out and felt a small cut. She then thought there was glass stuck in her hand, contacted our office, and was informed to make appointment. Last Tdap was 08/06/19. She has been soaking her finger and today does not feel like there is anything stuck in there. No numbness or pain. No redness or pus. No fevers.  Past Medical History:  Diagnosis Date  . Allergy   . History of chicken pox   . Hyperlipidemia   . Hypertension   . Status post dilation and curettage     Past Surgical History:  Procedure Laterality Date  . ADENOIDECTOMY    . DILATION AND CURETTAGE OF UTERUS    . TONSILLECTOMY    . WISDOM TOOTH EXTRACTION      Family History  Problem Relation Age of Onset  . Arthritis Mother   . Cancer Mother   . Diabetes Mother   . Early death Father   . Heart disease Father   . Hypertension Father   . Heart attack Father   . Stroke Son   . Diabetes Maternal Grandmother     Social History   Socioeconomic History  . Marital status: Married    Spouse name: Not on file  . Number of children: Not on file  . Years of education: Not on file  . Highest education level: Not on file  Occupational History  . Not on file  Tobacco Use  . Smoking status: Never Smoker  . Smokeless tobacco: Never Used  Vaping Use  . Vaping Use: Never used  Substance and Sexual Activity  . Alcohol use: Yes  . Drug use: Never  . Sexual activity: Yes  Other Topics Concern  . Not on file  Social History Narrative  . Not on file   Social Determinants of Health   Financial Resource Strain: Not on file  Food Insecurity: Not on file  Transportation Needs: Not on file   Physical Activity: Not on file  Stress: Not on file  Social Connections: Not on file  Intimate Partner Violence: Not on file    Outpatient Medications Prior to Visit  Medication Sig Dispense Refill  . azelastine (ASTELIN) 0.1 % nasal spray PLACE 1 SPRAY INTO BOTH NOSTRILS IN THE MORNING. USE IN EACH NOSTRIL AS DIRECTED 90 mL 1  . Calcium-Magnesium-Vitamin D (CALCIUM MAGNESIUM PO) Take by mouth.    . Cetirizine HCl (ZYRTEC ALLERGY PO)     . clotrimazole-betamethasone (LOTRISONE) lotion APPLY TOPICALLY TWO TIMES ADAY 30 mL 1  . diclofenac sodium (VOLTAREN) 1 % GEL Voltaren 1 % topical gel    . Garlic 614 MG TABS Take by mouth.    . hydrochlorothiazide (HYDRODIURIL) 25 MG tablet TAKE 1 TABLET DAILY 60 tablet 0  . ketoconazole (NIZORAL) 2 % shampoo Use twice a week 120 mL 3  . Misc Natural Products (LUTEIN 20 PO) Take 1 tablet by mouth daily.     . montelukast (SINGULAIR) 10 MG tablet Take 1 tablet (10 mg total) by mouth at bedtime. 90 tablet 1  . Multiple Vitamins-Minerals (CVS SPECTRAVITE ADULT 50+ PO) Take by mouth.    Marland Kitchen  Omega-3 Fatty Acids (FISH OIL PO) Take by mouth.     No facility-administered medications prior to visit.    Allergies  Allergen Reactions  . Statins Other (See Comments)    Muscle pain w/statin drugs    Review of Systems REFER TO HPI FOR PERTINENT POSITIVES AND NEGATIVES     Objective:    Physical Exam Musculoskeletal:     Comments: PAD OF R MIDDLE FINGER VERY PINPOINT AREA OF HEALING LACERATION AS PT DESCRIBED. NO PAIN. N/V INTACT. NO SURROUNDING ERYTHEMA OR EDEMA.     BP 108/72   Pulse 81   Temp 98.2 F (36.8 C)   Ht 5\' 1"  (1.549 m)   Wt 124 lb 3.2 oz (56.3 kg)   LMP  (LMP Unknown)   SpO2 99%   BMI 23.47 kg/m  Wt Readings from Last 3 Encounters:  05/17/20 124 lb 3.2 oz (56.3 kg)  05/12/20 124 lb (56.2 kg)  02/09/20 124 lb (56.2 kg)    Health Maintenance Due  Topic Date Due  . PAP SMEAR-Modifier  07/04/2020    There are no preventive  care reminders to display for this patient.   Lab Results  Component Value Date   TSH 2.46 08/07/2019   Lab Results  Component Value Date   WBC 5.3 08/07/2019   HGB 13.9 08/07/2019   HCT 40.7 08/07/2019   MCV 91.4 08/07/2019   PLT 267.0 08/07/2019   Lab Results  Component Value Date   NA 141 08/07/2019   K 3.8 08/07/2019   CO2 33 (H) 08/07/2019   GLUCOSE 87 08/07/2019   BUN 16 08/07/2019   CREATININE 0.73 08/07/2019   BILITOT 1.0 08/07/2019   ALKPHOS 46 08/07/2019   AST 16 08/07/2019   ALT 14 08/07/2019   PROT 6.6 08/07/2019   ALBUMIN 4.5 08/07/2019   CALCIUM 9.8 08/07/2019   GFR 82.29 08/07/2019   Lab Results  Component Value Date   CHOL 258 (H) 08/07/2019   Lab Results  Component Value Date   HDL 104.00 08/07/2019   Lab Results  Component Value Date   LDLCALC 139 (H) 08/07/2019   Lab Results  Component Value Date   TRIG 78.0 08/07/2019   Lab Results  Component Value Date   CHOLHDL 2 08/07/2019   No results found for: HGBA1C     Assessment & Plan:   Problem List Items Addressed This Visit   None    1. Superficial laceration of finger Reassured pt that her Tdap is UTD and finger appears to be healing normally. I did not open and explore the area as patient describes that the sensation of foreign body is now resolved. She will monitor the area and call if any changes.  This visit occurred during the SARS-CoV-2 public health emergency.  Safety protocols were in place, including screening questions prior to the visit, additional usage of staff PPE, and extensive cleaning of exam room while observing appropriate contact time as indicated for disinfecting solutions.    Zafirah Vanzee M Nakaya Mishkin, PA-C

## 2020-06-12 ENCOUNTER — Encounter (HOSPITAL_COMMUNITY): Payer: Self-pay | Admitting: Emergency Medicine

## 2020-06-12 ENCOUNTER — Other Ambulatory Visit: Payer: Self-pay

## 2020-06-12 ENCOUNTER — Emergency Department (HOSPITAL_COMMUNITY)
Admission: EM | Admit: 2020-06-12 | Discharge: 2020-06-12 | Disposition: A | Discharge status: Home / Self Care | Payer: No Typology Code available for payment source | Attending: Emergency Medicine | Admitting: Emergency Medicine

## 2020-06-12 DIAGNOSIS — L309 Dermatitis, unspecified: Secondary | ICD-10-CM

## 2020-06-12 DIAGNOSIS — R5383 Other fatigue: Secondary | ICD-10-CM | POA: Insufficient documentation

## 2020-06-12 DIAGNOSIS — I10 Essential (primary) hypertension: Secondary | ICD-10-CM | POA: Insufficient documentation

## 2020-06-12 DIAGNOSIS — Z79899 Other long term (current) drug therapy: Secondary | ICD-10-CM | POA: Diagnosis not present

## 2020-06-12 DIAGNOSIS — R21 Rash and other nonspecific skin eruption: Secondary | ICD-10-CM | POA: Diagnosis present

## 2020-06-12 NOTE — ED Provider Notes (Signed)
[place] [facility] DEPT Provider Note   CSN: [id number] Arrival date & time: 06/12/20  2100     History Chief Complaint  Patient presents with  . Skin Problem    Shari Cook is a 60 y.o. female.  60 y.o female with a PMH of HTN, hyperlipidemia, presents to the ED with a chief complaint of rash to her left inner thigh which showed up this evening x 3 hours ago.  Patient reports she received her COVID-19 booster yesterday, reports that she felt very fatigued today.  She noted a discoloration in her skin to the left inner thigh.  Reports she took a picture of this and did an MD live and she was instructed to be seen in the ED for a possible blood clot.  Upon evaluation while in the ED, skin redness has now dissipated, to 3 pinpoint to the left inner thigh.  She has not taken any medications for this.  Does report she usually walks outside, however has not noted any tick bites.  She does not have any systemic signs such as fever, abdominal pain, chest pain, shortness of breath, other complaints.  No prior clotting disorder, no prior surgery, no prior history of blood clots.  The history is provided by the patient.       Past Medical History:  Diagnosis Date  . Allergy   . History of chicken pox   . Hyperlipidemia   . Hypertension   . Status post dilation and curettage     Patient Active Problem List   Diagnosis Date Noted  . Thyroid nodule 11/26/2018  . Family history of early CAD 07/03/2018  . Benign essential HTN 06/13/2018  . Hyperlipidemia, mixed 06/13/2018    Past Surgical History:  Procedure Laterality Date  . ADENOIDECTOMY    . DILATION AND CURETTAGE OF UTERUS    . TONSILLECTOMY    . WISDOM TOOTH EXTRACTION       OB History    Gravida  2   Para      Term      Preterm      AB      Living  2     SAB      IAB      Ectopic      Multiple      Live Births              Family History  Problem Relation Age of Onset   . Arthritis Mother   . Cancer Mother   . Diabetes Mother   . Early death Father   . Heart disease Father   . Hypertension Father   . Heart attack Father   . Stroke Son   . Diabetes Maternal Grandmother     Social History   Tobacco Use  . Smoking status: Never Smoker  . Smokeless tobacco: Never Used  Vaping Use  . Vaping Use: Never used  Substance Use Topics  . Alcohol use: Yes  . Drug use: Never    Home Medications Prior to Admission medications   Medication Sig Start Date End Date Taking? Authorizing Provider  azelastine (ASTELIN) 0.1 % nasal spray PLACE 1 SPRAY INTO BOTH NOSTRILS IN THE MORNING. USE IN EACH NOSTRIL AS DIRECTED 12/30/19   Orma Flaming, MD  Calcium-Magnesium-Vitamin D (CALCIUM MAGNESIUM PO) Take by mouth.    [provider]  Cetirizine HCl (ZYRTEC ALLERGY PO)  08/05/18   [provider]  clotrimazole-betamethasone (LOTRISONE) lotion APPLY TOPICALLY TWO  TIMES ADAY 03/21/20   Orma Flaming, MD  diclofenac sodium (VOLTAREN) 1 % GEL Voltaren 1 % topical gel    [provider]  Garlic 250 MG TABS Take by mouth.    [provider]  hydrochlorothiazide (HYDRODIURIL) 25 MG tablet TAKE 1 TABLET DAILY 05/13/20   Orma Flaming, MD  ketoconazole (NIZORAL) 2 % shampoo Use twice a week 06/23/18   Orma Flaming, MD  Misc Natural Products (LUTEIN 20 PO) Take 1 tablet by mouth daily.     [provider]  montelukast (SINGULAIR) 10 MG tablet Take 1 tablet (10 mg total) by mouth at bedtime. 12/18/19   Orma Flaming, MD  Multiple Vitamins-Minerals (CVS SPECTRAVITE ADULT 50+ PO) Take by mouth.    [provider]  Omega-3 Fatty Acids (FISH OIL PO) Take by mouth.    [provider]    Allergies    Statins  Review of Systems   Review of Systems  Constitutional: Negative for fever.  Skin: Positive for rash.    Physical Exam Updated Vital Signs BP (!) 155/93   Pulse 83   Temp 99.3 F (37.4 C) (Oral)   Resp  18   LMP  (LMP Unknown)   SpO2 99%   Physical Exam Vitals and nursing note reviewed.  Constitutional:      Appearance: Normal appearance.  HENT:     Head: Normocephalic and atraumatic.  Eyes:     Pupils: Pupils are equal, round, and reactive to light.  Cardiovascular:     Rate and Rhythm: Normal rate.     Comments: No bilateral pitting edema, no calf tenderness. Pulmonary:     Effort: Pulmonary effort is normal.     Breath sounds: No wheezing or rales.  Abdominal:     General: Abdomen is flat.     Tenderness: There is no abdominal tenderness. There is no right CVA tenderness or left CVA tenderness.  Musculoskeletal:     Cervical back: Normal range of motion and neck supple.     Right lower leg: No edema.     Left lower leg: No edema.  Skin:    General: Skin is warm and dry.     Coloration: Skin is mottled.     Findings: Erythema present.          Comments: Pinpoint mottled skin with 3 points present to the inner left thigh without any tenderness to palpation.   Neurological:     Mental Status: She is alert and oriented to person, place, and time.     ED Results / Procedures / Treatments   Labs (all labs ordered are listed, but only abnormal results are displayed) Labs Reviewed - No data to display  EKG None  Radiology No results found.  Procedures Procedures   Medications Ordered in ED Medications - No data to display  ED Course  I have reviewed the triage vital signs and the nursing notes.  Pertinent labs & imaging results that were available during my care of the patient were reviewed by me and considered in my medical decision making (see chart for details).    MDM Rules/Calculators/A&P     Patient presents to the ED with a chief complaint of lateral skin to the left inner thigh which began 3 hours prior to arrival.  She reports she was at home, when she suddenly noted this in her inner thigh, states that she had a visit with MD live, was referred to  the ED for further evaluation  rule out blood clot.  Patient also reports that she received her COVID-19 booster yesterday, states that she is filled rundown and fatigued the whole day.  She does not have any systemic signs such as fever, chest pain, shortness of breath, abdominal pain.  She denies any prior history of blood clots, clotting disorder, recent prolonged immobilization.  During evaluation minimal mottle skin is present, there is 3 points that are blanchable.  No surrounding erythema, no streaking in the skin, no calf tenderness, no bilateral pitting edema.  Neuro risk factors such as clotting disorder, prolonged immobilization, recent surgery or recent travel.  We discussed following up with PCP as needed.  Should she develop any systemic signs she may return to the emergency department.  Patient shows no recent management, return precautions discussed at length.  Patient stable for discharge   Portions of this note were generated with Dragon dictation software. Dictation errors may occur despite best attempts at proofreading.  Final Clinical Impression(s) / ED Diagnoses Final diagnoses:  Dermatitis    Rx / DC Orders ED Discharge Orders    None       Janeece Fitting, Hershal Coria 06/12/20 2251    Tegeler, Gwenyth Allegra, MD 06/13/20 0001

## 2020-06-12 NOTE — Discharge Instructions (Addendum)
If you experience any fever, abdominal pain, shortness of breath or worsening symptoms please return to the ED.    Follow up with your primary care physician in the upcoming week for a recheck.

## 2020-06-12 NOTE — ED Triage Notes (Signed)
Patient arrives stating she noticed a patch of "mottling" on her thigh. No symptoms or pain. Patient states she was told she needed an [place] by the teledoc she showed a picture to.

## 2020-06-13 ENCOUNTER — Encounter: Payer: Self-pay | Admitting: Family Medicine

## 2020-07-12 ENCOUNTER — Other Ambulatory Visit: Payer: Self-pay | Admitting: Family Medicine

## 2020-08-09 ENCOUNTER — Ambulatory Visit: Payer: No Typology Code available for payment source | Admitting: Allergy & Immunology

## 2020-08-09 ENCOUNTER — Other Ambulatory Visit: Payer: Self-pay

## 2020-08-09 ENCOUNTER — Encounter: Payer: Self-pay | Admitting: Allergy & Immunology

## 2020-08-09 VITALS — BP 128/80 | HR 64 | Temp 98.1°F | Resp 16 | Ht 61.0 in | Wt 125.6 lb

## 2020-08-09 DIAGNOSIS — J310 Chronic rhinitis: Secondary | ICD-10-CM

## 2020-08-09 DIAGNOSIS — J029 Acute pharyngitis, unspecified: Secondary | ICD-10-CM

## 2020-08-09 NOTE — Patient Instructions (Signed)
1. Pharyngitis - I do not think that this is related to allergies at all since the antihistamines and the nose sprays did not help in a timely fashion.  - We are going to send you to ENT.  2. No follow-ups on file.    Please inform us of any Emergency Department visits, hospitalizations, or changes in symptoms. Call us before going to the ED for breathing or allergy symptoms since we might be able to fit you in for a sick visit. Feel free to contact us anytime with any questions, problems, or concerns.  It was a pleasure to meet you today!  Websites that have reliable patient information: 1. American Academy of Asthma, Allergy, and Immunology: www.aaaai.org 2. Food Allergy Research and Education (FARE): foodallergy.org 3. Mothers of Asthmatics: http://www.asthmacommunitynetwork.org 4. American College of Allergy, Asthma, and Immunology: www.acaai.org   COVID-19 Vaccine Information can be found at: ShippingScam.co.uk For questions related to vaccine distribution or appointments, please email [email or web address] or call [phone].     "Like" [place] on Facebook and Instagram for our latest updates!       Make sure you are registered to vote! If you have moved or changed any of your contact information, you will need to get this updated before voting!  In some cases, you MAY be able to register to vote online: [email or web address]

## 2020-08-09 NOTE — Progress Notes (Signed)
FOLLOW UP  Date of Service/Encounter:  08/09/20   Assessment:   Pharyngitis    Chronic non-allergic rhinitis - deferred on more aggressive allergy testing since this does not sound like allergic rhinitis given the fact that symptoms do not improve within days of starting antihistamines and no sprays  Plan/Recommendations:   1. Pharyngitis - I do not think that this is related to allergies at all since the antihistamines and the nose sprays did not help in a timely fashion.  - I am going to send the note to your PCP recommending an ENT evaluation. - You can try Pepcid for a week or so to see if this helps.   2. Return if symptoms worsen or fail to improve.    Subjective:   Shari Cook is a 60 y.o. female presenting today for follow up of  Chief Complaint  Patient presents with   Other    Has the lingering sore throat - she went to PCP for sore throat and he sent her here to allergy and asthma    Sinus Problem    Pressure, headaches,    Shari Cook has a history of the following: Patient Active Problem List   Diagnosis Date Noted   Thyroid nodule 11/26/2018   Family history of early CAD 07/03/2018   Benign essential HTN 06/13/2018   Hyperlipidemia, mixed 06/13/2018    History obtained from: chart review and patient.  Shari Cook is a 60 y.o. female presenting for a follow up visit. She was last seen in December 2021.  At that time, we did testing to the entire environmental allergy panel and this was negative.  We stopped her Singulair, Zyrtec, and no sprays.  We added on Flonase and Astelin to be used during future episodes of sore throat.  Since the last visit, she has continued to have sore throats. She has not kept a diary of this. She has not been using her nose sprays. She has never had an itchy throat at all. She typically does not treat it with anything at all. After taking two types of nasal sprays and cetirizine for several weeks, she did fine for a  while. This was 3 weeks  of medications.   She has not been to see ENT at all.  She would prefer that her PCP referred her to ENT.  She has had a headache on and off for a couple of days. One month ago, she had pressure levels in her eyes that was elevated.  She thinks this was around 20.  She has a follow-up tomorrow on that.  She has never had reflux.  She is open to trying Pepcid for a week or so.  Otherwise, there have been no changes to her past medical history, surgical history, family history, or social history.    Review of Systems  Constitutional: Negative.  Negative for fever, malaise/fatigue and weight loss.  HENT: Negative.  Negative for congestion, ear discharge and ear pain.        Positive for sore throat.  Eyes:  Negative for pain, discharge and redness.  Respiratory:  Negative for cough, sputum production, shortness of breath and wheezing.   Cardiovascular: Negative.  Negative for chest pain and palpitations.  Gastrointestinal:  Negative for abdominal pain, heartburn, nausea and vomiting.  Skin: Negative.  Negative for itching and rash.  Neurological:  Negative for dizziness and headaches.  Endo/Heme/Allergies:  Negative for environmental allergies. Does not bruise/bleed easily.  Objective:   Blood pressure 128/80, pulse 64, temperature 98.1 F (36.7 C), resp. rate 16, height 5\' 1"  (1.549 m), weight 125 lb 9.6 oz (57 kg), SpO2 99 %. Body mass index is 23.73 kg/m.   Physical Exam:  Physical Exam Constitutional:      Appearance: She is well-developed.  HENT:     Head: Normocephalic and atraumatic.     Right Ear: Tympanic membrane, ear canal and external ear normal.     Left Ear: Tympanic membrane, ear canal and external ear normal.     Nose: No nasal deformity, septal deviation, mucosal edema or rhinorrhea.     Right Turbinates: Not enlarged or swollen.     Left Turbinates: Not enlarged or swollen.     Right Sinus: No maxillary sinus tenderness or  frontal sinus tenderness.     Left Sinus: No maxillary sinus tenderness or frontal sinus tenderness.     Mouth/Throat:     Mouth: Mucous membranes are not pale and not dry.     Pharynx: Uvula midline.     Comments: Throat appears normal.  It is not erythematous. Eyes:     General: Lids are normal. No allergic shiner.       Right eye: No discharge.        Left eye: No discharge.     Conjunctiva/sclera: Conjunctivae normal.     Right eye: Right conjunctiva is not injected. No chemosis.    Left eye: Left conjunctiva is not injected. No chemosis.    Pupils: Pupils are equal, round, and reactive to light.  Cardiovascular:     Rate and Rhythm: Normal rate and regular rhythm.     Heart sounds: Normal heart sounds.  Pulmonary:     Effort: Pulmonary effort is normal. No tachypnea, accessory muscle usage or respiratory distress.     Breath sounds: Normal breath sounds. No wheezing, rhonchi or rales.     Comments: Moving air well in all lung fields. Chest:     Chest wall: No tenderness.  Lymphadenopathy:     Cervical: No cervical adenopathy.  Skin:    Coloration: Skin is not pale.     Findings: No abrasion, erythema, petechiae or rash. Rash is not papular, urticarial or vesicular.  Neurological:     Mental Status: She is alert.  Psychiatric:        Behavior: Behavior is cooperative.     Diagnostic studies: none      Salvatore Marvel, MD  [facility]

## 2020-08-10 ENCOUNTER — Encounter: Payer: No Typology Code available for payment source | Admitting: Family Medicine

## 2020-09-09 ENCOUNTER — Encounter: Payer: No Typology Code available for payment source | Admitting: Physician Assistant

## 2020-09-10 ENCOUNTER — Other Ambulatory Visit: Payer: Self-pay | Admitting: Family Medicine

## 2020-11-02 ENCOUNTER — Other Ambulatory Visit: Payer: Self-pay | Admitting: Physician Assistant

## 2020-11-08 ENCOUNTER — Encounter: Payer: Self-pay | Admitting: Physician Assistant

## 2020-11-08 ENCOUNTER — Other Ambulatory Visit: Payer: Self-pay

## 2020-11-08 ENCOUNTER — Ambulatory Visit (INDEPENDENT_AMBULATORY_CARE_PROVIDER_SITE_OTHER): Payer: No Typology Code available for payment source | Admitting: Physician Assistant

## 2020-11-08 VITALS — BP 127/85 | HR 79 | Temp 98.2°F | Ht 61.0 in | Wt 123.2 lb

## 2020-11-08 DIAGNOSIS — Z0000 Encounter for general adult medical examination without abnormal findings: Secondary | ICD-10-CM

## 2020-11-08 DIAGNOSIS — Z131 Encounter for screening for diabetes mellitus: Secondary | ICD-10-CM | POA: Diagnosis not present

## 2020-11-08 DIAGNOSIS — R21 Rash and other nonspecific skin eruption: Secondary | ICD-10-CM

## 2020-11-08 DIAGNOSIS — Z13220 Encounter for screening for lipoid disorders: Secondary | ICD-10-CM

## 2020-11-08 LAB — CBC WITH DIFFERENTIAL/PLATELET
Basophils Absolute: 0.1 10*3/uL (ref 0.0–0.1)
Basophils Relative: 1.2 % (ref 0.0–3.0)
Eosinophils Absolute: 0.2 10*3/uL (ref 0.0–0.7)
Eosinophils Relative: 5 % (ref 0.0–5.0)
HCT: 41 % (ref 36.0–46.0)
Hemoglobin: 14 g/dL (ref 12.0–15.0)
Lymphocytes Relative: 34 % (ref 12.0–46.0)
Lymphs Abs: 1.5 10*3/uL (ref 0.7–4.0)
MCHC: 34.1 g/dL (ref 30.0–36.0)
MCV: 91.9 fl (ref 78.0–100.0)
Monocytes Absolute: 0.3 10*3/uL (ref 0.1–1.0)
Monocytes Relative: 6.2 % (ref 3.0–12.0)
Neutro Abs: 2.4 10*3/uL (ref 1.4–7.7)
Neutrophils Relative %: 53.6 % (ref 43.0–77.0)
Platelets: 275 10*3/uL (ref 150.0–400.0)
RBC: 4.46 Mil/uL (ref 3.87–5.11)
RDW: 13.2 % (ref 11.5–15.5)
WBC: 4.5 10*3/uL (ref 4.0–10.5)

## 2020-11-08 LAB — COMPREHENSIVE METABOLIC PANEL
ALT: 18 U/L (ref 0–35)
AST: 22 U/L (ref 0–37)
Albumin: 4.3 g/dL (ref 3.5–5.2)
Alkaline Phosphatase: 42 U/L (ref 39–117)
BUN: 14 mg/dL (ref 6–23)
CO2: 31 mEq/L (ref 19–32)
Calcium: 9.6 mg/dL (ref 8.4–10.5)
Chloride: 103 mEq/L (ref 96–112)
Creatinine, Ser: 0.71 mg/dL (ref 0.40–1.20)
GFR: 94.1 mL/min (ref >60.00)
Glucose, Bld: 81 mg/dL (ref 70–99)
Potassium: 3.9 mEq/L (ref 3.5–5.1)
Sodium: 141 mEq/L (ref 135–145)
Total Bilirubin: 0.7 mg/dL (ref 0.2–1.2)
Total Protein: 6.9 g/dL (ref 6.0–8.3)

## 2020-11-08 LAB — LIPID PANEL
Cholesterol: 247 mg/dL — ABNORMAL HIGH (ref 0–200)
HDL: 106.6 mg/dL (ref >39.00)
LDL Cholesterol: 128 mg/dL — ABNORMAL HIGH (ref 0–99)
NonHDL: 140.6
Total CHOL/HDL Ratio: 2
Triglycerides: 61 mg/dL (ref 0.0–149.0)
VLDL: 12.2 mg/dL (ref 0.0–40.0)

## 2020-11-08 MED ORDER — KETOCONAZOLE 2 % EX SHAM: MEDICATED_SHAMPOO | CUTANEOUS | 0 refills | Status: DC

## 2020-11-08 MED ORDER — CLOTRIMAZOLE-BETAMETHASONE 1-0.05 % EX CREA: 1.0000 "application " | TOPICAL_CREAM | Freq: Two times a day (BID) | CUTANEOUS | 0 refills | Status: DC

## 2020-11-08 NOTE — Patient Instructions (Signed)
Good to see you today! Please go to the lab for blood work and I will send results through [facility]. Keep up great work with healthy diet and exercise!

## 2020-11-08 NOTE — Progress Notes (Signed)
Established Patient Office Visit  Subjective:  Patient ID: Shari Cook, female    DOB: [birth year]  Age: 60 y.o. MRN: [id number]  CC:  Chief Complaint  Patient presents with  . Annual Exam    HPI VIVIANNA Cook presents for annual exam and TOC from Dr. Rogers Blocker.  Acute concerns: Skin irritations on back, neck, behind ears, comes up periodically. Very itchy.  Also left buttock skin patch occasionally. No issues today. Does well with Lotrisone.  Does not seem to be season dependent.  Started when living in [place] (moved here in 2020). No skin issues as a child.   Health maintenance: Lifestyle/ exercise: 20 minutes yoga / stretching daily; two walks 8-10k steps daily  Nutrition: Mostly at home meals, occasional out to eat; limits fried foods Mental health: Doing well Caffeine: Different teas every morning Sleep: Well enough per patient  Substance use: None  Sexual activity: No Immunizations: UTD, will do Flu shot in a few weeks Colonoscopy: UTD  Pap: Sees GYN Mammogram: UTD   Past Medical History:  Diagnosis Date  . Allergy   . History of chicken pox   . Hyperlipidemia   . Hypertension   . Recurrent upper respiratory infection (URI)   . Status post dilation and curettage     Past Surgical History:  Procedure Laterality Date  . ADENOIDECTOMY    . DILATION AND CURETTAGE OF UTERUS    . TONSILLECTOMY    . WISDOM TOOTH EXTRACTION      Family History  Problem Relation Age of Onset  . Arthritis Mother   . Cancer Mother   . Diabetes Mother   . Early death Father   . Heart disease Father   . Hypertension Father   . Heart attack Father   . Stroke Son   . Diabetes Maternal Grandmother     Social History   Socioeconomic History  . Marital status: Married    Spouse name: Not on file  . Number of children: Not on file  . Years of education: Not on file  . Highest education level: Not on file  Occupational History  . Not on file  Tobacco Use  .  Smoking status: Never  . Smokeless tobacco: Never  Vaping Use  . Vaping Use: Never used  Substance and Sexual Activity  . Alcohol use: Yes  . Drug use: Never  . Sexual activity: Yes  Other Topics Concern  . Not on file  Social History Narrative  . Not on file   Social Determinants of Health   Financial Resource Strain: Not on file  Food Insecurity: Not on file  Transportation Needs: Not on file  Physical Activity: Not on file  Stress: Not on file  Social Connections: Not on file  Intimate Partner Violence: Not on file    Outpatient Medications Prior to Visit  Medication Sig Dispense Refill  . Calcium-Magnesium-Vitamin D (CALCIUM MAGNESIUM PO) Take by mouth.    . clotrimazole-betamethasone (LOTRISONE) lotion APPLY TOPICALLY TWO TIMES ADAY 30 mL 1  . diclofenac sodium (VOLTAREN) 1 % GEL Voltaren 1 % topical gel    . Garlic 409 MG TABS Take by mouth.    . hydrochlorothiazide (HYDRODIURIL) 25 MG tablet TAKE 1 TABLET DAILY 60 tablet 0  . ketoconazole (NIZORAL) 2 % shampoo Use twice a week 120 mL 3  . Misc Natural Products (LUTEIN 20 PO) Take 1 tablet by mouth daily.     . Multiple Vitamins-Minerals (CVS SPECTRAVITE ADULT 50+ PO) Take  by mouth.    . Omega-3 Fatty Acids (FISH OIL PO) Take by mouth.    Marland Kitchen azelastine (ASTELIN) 0.1 % nasal spray PLACE 1 SPRAY INTO BOTH NOSTRILS IN THE MORNING. USE IN EACH NOSTRIL AS DIRECTED 90 mL 1  . Cetirizine HCl (ZYRTEC ALLERGY PO)     . montelukast (SINGULAIR) 10 MG tablet Take 1 tablet (10 mg total) by mouth at bedtime. 90 tablet 1   No facility-administered medications prior to visit.    Allergies  Allergen Reactions  . Statins Other (See Comments)    Muscle pain w/statin drugs    ROS Review of Systems  Constitutional:  Negative for activity change, appetite change, fever and unexpected weight change.  HENT:  Negative for congestion.   Eyes:  Negative for visual disturbance.  Respiratory:  Negative for apnea, cough and shortness of  breath.   Cardiovascular:  Negative for chest pain, palpitations and leg swelling.  Gastrointestinal:  Negative for abdominal pain, blood in stool, constipation and diarrhea.  Endocrine: Negative for polydipsia, polyphagia and polyuria.  Genitourinary:  Negative for dysuria and pelvic pain.  Musculoskeletal:  Negative for arthralgias.  Skin:  Negative for rash.  Neurological:  Negative for dizziness, weakness and headaches.  Hematological:  Negative for adenopathy. Does not bruise/bleed easily.  Psychiatric/Behavioral:  Negative for sleep disturbance and suicidal ideas. The patient is not nervous/anxious.      Objective:    Physical Exam Vitals and nursing note reviewed.  Constitutional:      General: She is not in acute distress.    Appearance: Normal appearance. She is normal weight.  HENT:     Head: Normocephalic.     Right Ear: Tympanic membrane, ear canal and external ear normal.     Left Ear: Tympanic membrane, ear canal and external ear normal.     Nose: Nose normal.     Mouth/Throat:     Mouth: Mucous membranes are moist.  Eyes:     Extraocular Movements: Extraocular movements intact.     Conjunctiva/sclera: Conjunctivae normal.     Pupils: Pupils are equal, round, and reactive to light.  Cardiovascular:     Rate and Rhythm: Normal rate and regular rhythm.     Pulses: Normal pulses.     Heart sounds: No murmur heard. Pulmonary:     Effort: Pulmonary effort is normal.     Breath sounds: Normal breath sounds.  Abdominal:     General: Abdomen is flat. Bowel sounds are normal.     Palpations: Abdomen is soft.     Tenderness: There is no abdominal tenderness.  Musculoskeletal:        General: Normal range of motion.     Cervical back: Normal range of motion.  Skin:    General: Skin is warm.  Neurological:     General: No focal deficit present.     Mental Status: She is alert and oriented to person, place, and time.     Gait: Gait normal.  Psychiatric:        Mood  and Affect: Mood normal.        Behavior: Behavior normal.    BP 127/85   Pulse 79   Temp 98.2 F (36.8 C)   Ht 5\' 1"  (1.549 m)   Wt 123 lb 3.2 oz (55.9 kg)   LMP  (LMP Unknown)   SpO2 97%   BMI 23.28 kg/m  Wt Readings from Last 3 Encounters:  11/08/20 123 lb 3.2 oz (55.9 kg)  08/09/20 125 lb 9.6 oz (57 kg)  05/17/20 124 lb 3.2 oz (56.3 kg)     Health Maintenance Due  Topic Date Due  . PAP SMEAR-Modifier  07/04/2020  . INFLUENZA VACCINE  09/19/2020    There are no preventive care reminders to display for this patient.  Lab Results  Component Value Date   TSH 2.46 08/07/2019   Lab Results  Component Value Date   WBC 5.3 08/07/2019   HGB 13.9 08/07/2019   HCT 40.7 08/07/2019   MCV 91.4 08/07/2019   PLT 267.0 08/07/2019   Lab Results  Component Value Date   NA 141 08/07/2019   K 3.8 08/07/2019   CO2 33 (H) 08/07/2019   GLUCOSE 87 08/07/2019   BUN 16 08/07/2019   CREATININE 0.73 08/07/2019   BILITOT 1.0 08/07/2019   ALKPHOS 46 08/07/2019   AST 16 08/07/2019   ALT 14 08/07/2019   PROT 6.6 08/07/2019   ALBUMIN 4.5 08/07/2019   CALCIUM 9.8 08/07/2019   GFR 82.29 08/07/2019   Lab Results  Component Value Date   CHOL 258 (H) 08/07/2019   Lab Results  Component Value Date   HDL 104.00 08/07/2019   Lab Results  Component Value Date   LDLCALC 139 (H) 08/07/2019   Lab Results  Component Value Date   TRIG 78.0 08/07/2019   Lab Results  Component Value Date   CHOLHDL 2 08/07/2019   No results found for: HGBA1C    Assessment & Plan:   Problem List Items Addressed This Visit   None  Plan: Age-appropriate screening and counseling performed today. Will check fasting labs and call with results. Preventive measures discussed and printed in AVS for patient. She is doing great with exercise. She will do flu vaccine and COVID booster this Fall. GYN for female care annually. Refilled cream for intermittent rash flare.   Follow-up: No follow-ups on  file.    Taylin Leder M Kempton Milne, PA-C

## 2020-11-10 ENCOUNTER — Telehealth: Payer: Self-pay

## 2020-11-10 NOTE — Telephone Encounter (Signed)
Pt called about lab results. °

## 2020-11-10 NOTE — Telephone Encounter (Signed)
Notified patient of lab results.Patient voices understanding.  

## 2020-11-20 ENCOUNTER — Encounter: Payer: Self-pay | Admitting: Physician Assistant

## 2020-11-24 ENCOUNTER — Other Ambulatory Visit: Payer: Self-pay

## 2020-11-24 ENCOUNTER — Other Ambulatory Visit: Payer: Self-pay | Admitting: Physician Assistant

## 2020-11-24 MED ORDER — CLOTRIMAZOLE-BETAMETHASONE 1-0.05 % EX CREA: 1.0000 "application " | TOPICAL_CREAM | Freq: Two times a day (BID) | CUTANEOUS | 2 refills | Status: DC

## 2020-11-24 MED ORDER — KETOCONAZOLE 2 % EX SHAM: MEDICATED_SHAMPOO | CUTANEOUS | 1 refills | Status: DC

## 2020-11-24 MED ORDER — CLOTRIMAZOLE-BETAMETHASONE 1-0.05 % EX LOTN: TOPICAL_LOTION | Freq: Two times a day (BID) | CUTANEOUS | 1 refills | Status: DC

## 2020-11-24 MED ORDER — KETOCONAZOLE 2 % EX SHAM: MEDICATED_SHAMPOO | CUTANEOUS | 2 refills | Status: AC

## 2020-12-20 ENCOUNTER — Other Ambulatory Visit: Payer: Self-pay | Admitting: Obstetrics and Gynecology

## 2020-12-20 DIAGNOSIS — Z1231 Encounter for screening mammogram for malignant neoplasm of breast: Secondary | ICD-10-CM

## 2021-01-01 ENCOUNTER — Other Ambulatory Visit: Payer: Self-pay | Admitting: Physician Assistant

## 2021-01-02 ENCOUNTER — Other Ambulatory Visit: Payer: Self-pay

## 2021-01-02 MED ORDER — HYDROCHLOROTHIAZIDE 25 MG PO TABS: 25.0000 mg | ORAL_TABLET | Freq: Every day | ORAL | 0 refills | Status: DC

## 2021-01-09 ENCOUNTER — Encounter: Payer: Self-pay | Admitting: Internal Medicine

## 2021-01-09 ENCOUNTER — Other Ambulatory Visit: Payer: Self-pay

## 2021-01-09 ENCOUNTER — Ambulatory Visit: Payer: No Typology Code available for payment source | Admitting: Internal Medicine

## 2021-01-09 VITALS — BP 108/72 | HR 84 | Ht 61.0 in | Wt 125.0 lb

## 2021-01-09 DIAGNOSIS — E042 Nontoxic multinodular goiter: Secondary | ICD-10-CM

## 2021-01-09 LAB — T4, FREE: Free T4: 0.83 ng/dL (ref 0.60–1.60)

## 2021-01-09 LAB — TSH: TSH: 2.24 u[IU]/mL (ref 0.35–5.50)

## 2021-01-09 NOTE — Progress Notes (Signed)
Name: Shari Cook  MRN/ DOB: [id number], 09-Jun-1960    Age/ Sex: 60 y.o., female     PCP: Allwardt, Randa Evens, PA-C   Reason for Endocrinology Evaluation: [place]     Initial Endocrinology Clinic Visit: 12/30/2018    PATIENT IDENTIFIER: Shari Cook is a 60 y.o., female with a past medical history of HTn and MNG. She has followed with [facility] since 12/30/2018 for consultative assistance with management of her MNG.   HISTORICAL SUMMARY:   Pt was noted to have thyromegaly during routine examination at her dentist's office, she was advised to follow up with PCP which prompted an ultrasound in 12/2018, she is status post FNA of the left mid nodule with benign cytology on 01/20/2019.   No radiation exposure  Denies depression and anxiety    No Fh of thyroid disease    SUBJECTIVE:    Today (01/09/2021):  Shari Cook is here for MNG.  Weight stable  Denies local neck symptoms  Denies constipation  nor diarrhea  Denies palpitations  Denies tremors.     HISTORY:  Past Medical History:  Past Medical History:  Diagnosis Date   Allergy    History of chicken pox    Hyperlipidemia    Hypertension    Recurrent upper respiratory infection (URI)    Status post dilation and curettage    Past Surgical History:  Past Surgical History:  Procedure Laterality Date   ADENOIDECTOMY     DILATION AND CURETTAGE OF UTERUS     TONSILLECTOMY     WISDOM TOOTH EXTRACTION     Social History:  reports that she has never smoked. She has never used smokeless tobacco. She reports current alcohol use. She reports that she does not use drugs. Family History:  Family History  Problem Relation Age of Onset   Arthritis Mother    Cancer Mother    Diabetes Mother    Early death Father    Heart disease Father    Hypertension Father    Heart attack Father    Stroke Son    Diabetes Maternal Grandmother      HOME MEDICATIONS: Allergies as of 01/09/2021        Reactions   Statins Other (See Comments)   Muscle pain w/statin drugs        Medication List        Accurate as of January 09, 2021  7:56 AM. If you have any questions, ask your nurse or doctor.          CALCIUM MAGNESIUM PO Take by mouth.   clotrimazole-betamethasone lotion Commonly known as: LOTRISONE APPLY TOPICALLY TWO TIMES ADAY   clotrimazole-betamethasone cream Commonly known as: Lotrisone Apply 1 application topically 2 (two) times daily.   clotrimazole-betamethasone lotion Commonly known as: LOTRISONE Apply topically 2 (two) times daily.   CVS SPECTRAVITE ADULT 50+ PO Take by mouth.   diclofenac sodium 1 % Gel Commonly known as: VOLTAREN Voltaren 1 % topical gel   FISH OIL PO Take by mouth.   Garlic 654 MG Tabs Take by mouth.   hydrochlorothiazide 25 MG tablet Commonly known as: HYDRODIURIL Take 1 tablet (25 mg total) by mouth daily.   ketoconazole 2 % shampoo Commonly known as: NIZORAL Use twice a week   ketoconazole 2 % shampoo Commonly known as: NIZORAL Use twice a week   LUTEIN 20 PO Take 1 tablet by mouth daily.          OBJECTIVE:  PHYSICAL EXAM: VS: BP 108/72 (BP Location: Left Arm, Patient Position: Sitting, Cuff Size: Small)   Pulse 84   Ht 5\' 1"  (1.549 m)   Wt 125 lb (56.7 kg)   LMP  (LMP Unknown)   SpO2 99%   BMI 23.62 kg/m    EXAM: General: Pt appears well and is in NAD  Neck: General: Supple without adenopathy. Thyroid: Thyroid size normal.  No goiter or nodules appreciated.  Lungs: Clear with good BS bilat with no rales, rhonchi, or wheezes  Heart: Auscultation: RRR.  Abdomen: Normoactive bowel sounds, soft, nontender, without masses or organomegaly palpable  Extremities:  BL LE: No pretibial edema normal ROM and strength.  Mental Status: Judgment, insight: Intact Orientation: Oriented to time, place, and person Mood and affect: No depression, anxiety, or agitation     DATA REVIEWED:    Latest Reference Range & Units 01/09/21 13:51  TSH 0.35 - 5.50 uIU/mL 2.24  T4,Free(Direct) 0.60 - 1.60 ng/dL 0.83     ASSESSMENT / PLAN / RECOMMENDATIONS:   MNG:   - Pt is clinically euthyroid  - NO local neck symptoms  - She is S/P benign FNA of the left mid nodule with benign cytology on 01/20/2019. - pt is asking about long term follow up plan, I explained to the pt a follow up of 5 years is required for stability, after that no serial ultrasound is needed  - Her last ultrasound showed both nodules decreased in size      F/U in 2 years     Signed electronically by: Mack Guise, MD  [facility]  [address] [place], [place] [address] Phone: [phone] FAX: [phone]      CC: Allwardt, Randa Evens, PA-C [place] [address] Phone: [phone]  Fax: [phone]   Return to Endocrinology clinic as below: Future Appointments  Date Time Provider [place]  01/09/2021  1:00 PM [facility] Klarich, Melanie Crazier, MD [facility] None  01/23/2021  3:50 PM [facility] 2 [facility]

## 2021-01-23 ENCOUNTER — Other Ambulatory Visit: Payer: Self-pay

## 2021-01-23 ENCOUNTER — Ambulatory Visit
Admission: RE | Admit: 2021-01-23 | Discharge: 2021-01-23 | Disposition: A | Discharge status: Home / Self Care | Payer: No Typology Code available for payment source | Source: Ambulatory Visit | Attending: Obstetrics and Gynecology | Admitting: Obstetrics and Gynecology

## 2021-01-23 DIAGNOSIS — Z1231 Encounter for screening mammogram for malignant neoplasm of breast: Secondary | ICD-10-CM

## 2021-01-26 ENCOUNTER — Other Ambulatory Visit: Payer: Self-pay | Admitting: Obstetrics and Gynecology

## 2021-01-26 DIAGNOSIS — R928 Other abnormal and inconclusive findings on diagnostic imaging of breast: Secondary | ICD-10-CM

## 2021-02-02 ENCOUNTER — Other Ambulatory Visit: Payer: No Typology Code available for payment source

## 2021-02-14 ENCOUNTER — Ambulatory Visit
Admission: RE | Admit: 2021-02-14 | Discharge: 2021-02-14 | Disposition: A | Discharge status: Home / Self Care | Payer: No Typology Code available for payment source | Source: Ambulatory Visit | Attending: Internal Medicine | Admitting: Internal Medicine

## 2021-02-14 DIAGNOSIS — E042 Nontoxic multinodular goiter: Secondary | ICD-10-CM

## 2021-03-01 ENCOUNTER — Encounter: Payer: Self-pay | Admitting: Physician Assistant

## 2021-03-03 ENCOUNTER — Ambulatory Visit
Admission: RE | Admit: 2021-03-03 | Discharge: 2021-03-03 | Disposition: A | Discharge status: Home / Self Care | Payer: No Typology Code available for payment source | Source: Ambulatory Visit | Attending: Obstetrics and Gynecology | Admitting: Obstetrics and Gynecology

## 2021-03-03 DIAGNOSIS — R928 Other abnormal and inconclusive findings on diagnostic imaging of breast: Secondary | ICD-10-CM

## 2021-03-07 ENCOUNTER — Other Ambulatory Visit: Payer: Self-pay

## 2021-03-07 ENCOUNTER — Ambulatory Visit (INDEPENDENT_AMBULATORY_CARE_PROVIDER_SITE_OTHER): Payer: No Typology Code available for payment source | Admitting: Physician Assistant

## 2021-03-07 VITALS — BP 139/83 | HR 82 | Temp 98.3°F | Ht 61.0 in | Wt 126.2 lb

## 2021-03-07 DIAGNOSIS — B079 Viral wart, unspecified: Secondary | ICD-10-CM | POA: Diagnosis not present

## 2021-03-07 DIAGNOSIS — D485 Neoplasm of uncertain behavior of skin: Secondary | ICD-10-CM

## 2021-03-07 NOTE — Progress Notes (Signed)
Subjective:    Patient ID: Shari Cook, female    DOB: Jun 18, 1960, 61 y.o.   MRN: [id number]  Chief Complaint  Patient presents with   Nevus    Left Calf    HPI Patient is in today for lesion left calf. Unsure when it came up, but it has some roughness to it and she made it bleed one day. No hx of melanoma or other skin cancers. No pain or irritation to the area. No surrounding redness or swelling. She would like it removed if possible.   Past Medical History:  Diagnosis Date   Allergy    History of chicken pox    Hyperlipidemia    Hypertension    Recurrent upper respiratory infection (URI)    Status post dilation and curettage     Past Surgical History:  Procedure Laterality Date   ADENOIDECTOMY     DILATION AND CURETTAGE OF UTERUS     TONSILLECTOMY     WISDOM TOOTH EXTRACTION      Family History  Problem Relation Age of Onset   Arthritis Mother    Cancer Mother    Diabetes Mother    Early death Father    Heart disease Father    Hypertension Father    Heart attack Father    Stroke Son    Diabetes Maternal Grandmother     Social History   Tobacco Use   Smoking status: Never   Smokeless tobacco: Never  Vaping Use   Vaping Use: Never used  Substance Use Topics   Alcohol use: Yes   Drug use: Never     Allergies  Allergen Reactions   Statins Other (See Comments)    Muscle pain w/statin drugs    Review of Systems NEGATIVE UNLESS OTHERWISE INDICATED IN HPI      Objective:     BP 139/83    Pulse 82    Temp 98.3 F (36.8 C)    Ht 5\' 1"  (1.549 m)    Wt 126 lb 3.2 oz (57.2 kg)    LMP  (LMP Unknown)    SpO2 100%    BMI 23.85 kg/m   Wt Readings from Last 3 Encounters:  03/07/21 126 lb 3.2 oz (57.2 kg)  01/09/21 125 lb (56.7 kg)  11/08/20 123 lb 3.2 oz (55.9 kg)    BP Readings from Last 3 Encounters:  03/07/21 139/83  01/09/21 108/72  11/08/20 127/85     Physical Exam Vitals and nursing note reviewed.  Constitutional:       Appearance: Normal appearance.  Skin:    Comments: Left posterior calf approx 0.5 cm raised oval lesion with overlying scaling, no surrounding erythema or edema; flesh colored. See photo.   Neurological:     Mental Status: She is alert.         Assessment & Plan:   Problem List Items Addressed This Visit   None Visit Diagnoses     Neoplasm of uncertain behavior of skin    -  Primary   Relevant Orders   Pathology ([facility])       1. Neoplasm of uncertain behavior of skin Most likely benign such as seborrheic keratosis, but in order to confirm this is not early SCC, biopsy advised today. Procedure explained and consent obtained. Area of left calf cleaned in usual manner. Local anesthesia with 3 cc of Lidocaine 1% with Epi, using a 25 gauge needle. Dermablade used to remove the lesion. Hemostasis with Drysol. Band-Aid with Vaseline  applied. Patient tolerated procedure well. Aftercare instructions provided. Lesion sent for pathology. Will call with pathology results. Pt knows to call sooner if any concerns.     Shari Bevans M Judd Mccubbin, PA-C

## 2021-03-07 NOTE — Patient Instructions (Signed)
We will call with pathology report. Thanks for coming in today! Call if any concerns.

## 2021-03-09 LAB — ANATOMIC PATHOLOGY REPORT

## 2021-04-01 ENCOUNTER — Other Ambulatory Visit: Payer: Self-pay | Admitting: Physician Assistant

## 2021-05-30 NOTE — Progress Notes (Signed)
61 y.o. G2P0 Married White or Caucasian Not Hispanic or Latino female here for annual exam.  No vaginal bleeding. Not sexually active. ?  ? ?No LMP recorded (lmp unknown). Patient is postmenopausal.          ?Sexually active: No.  ?The current method of family planning is post menopausal status.    ?Exercising: Yes.     20 min stretching and walking  ?Smoker:  no ? ?Health Maintenance: ?Pap:  07/04/17 negative, negative HRHPV 01/23/16   ?History of abnormal Pap:  no ?MMG:  03/03/21 density C Bi-rads 2 benign U/S same day reflecting same results.  ?BMD:   none  ?Colonoscopy: 2015 f/u 10 years  ?TDaP:  08/06/19 ?Gardasil: n/a ? ? reports that she has never smoked. She has never used smokeless tobacco. She reports current alcohol use. She reports that she does not use drugs. 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 2 grown kids (out of [place]). Granddaughter is 50.69 years old.  ? ?Past Medical History:  ?Diagnosis Date  ? Allergy   ? History of chicken pox   ? Hyperlipidemia   ? Hypertension   ? Recurrent upper respiratory infection (URI)   ? Status post dilation and curettage   ? ? ?Past Surgical History:  ?Procedure Laterality Date  ? ADENOIDECTOMY    ? DILATION AND CURETTAGE OF UTERUS    ? TONSILLECTOMY    ? WISDOM TOOTH EXTRACTION    ? ? ?Current Outpatient Medications  ?Medication Sig Dispense Refill  ? Calcium-Magnesium-Vitamin D (CALCIUM MAGNESIUM PO) Take by mouth.    ? clotrimazole-betamethasone (LOTRISONE) cream Apply 1 application topically 2 (two) times daily. 30 g 2  ? clotrimazole-betamethasone (LOTRISONE) lotion APPLY TOPICALLY TWO TIMES ADAY 30 mL 1  ? clotrimazole-betamethasone (LOTRISONE) lotion Apply topically 2 (two) times daily. 30 mL 1  ? Garlic 314 MG TABS Take by mouth.    ? hydrochlorothiazide (HYDRODIURIL) 25 MG tablet TAKE 1 TABLET DAILY 90 tablet 0  ? ketoconazole (NIZORAL) 2 % shampoo Use twice a week 100 mL 2  ? Misc Natural Products (LUTEIN 20 PO)  Take 1 tablet by mouth daily.     ? Multiple Vitamins-Minerals ([facility] SPECTRAVITE ADULT 50+ PO) Take by mouth.    ? Omega-3 Fatty Acids (FISH OIL PO) Take by mouth.    ? ?No current facility-administered medications for this visit.  ? ? ?Family History  ?Problem Relation Age of Onset  ? Arthritis Mother   ? Cancer Mother   ? Diabetes Mother   ? Early death Father   ? Heart disease Father   ? Hypertension Father   ? Heart attack Father   ? Stroke Son   ? Diabetes Maternal Grandmother   ? ? ?Review of Systems  ?All other systems reviewed and are negative. ? ?Exam:   ?BP 128/74   Pulse 80   Ht 5' 1.5" (1.562 m)   Wt 126 lb (57.2 kg)   LMP  (LMP Unknown)   SpO2 100%   BMI 23.42 kg/m?   Weight change: '@WEIGHTCHANGE'$ @ Height:   Height: 5' 1.5" (156.2 cm)  ?Ht Readings from Last 3 Encounters:  ?06/05/21 5' 1.5" (1.562 m)  ?03/07/21 '5\' 1"'$  (1.549 m)  ?01/09/21 '5\' 1"'$  (1.549 m)  ? ? ?General appearance: alert, cooperative and appears stated age ?Head: Normocephalic, without obvious abnormality, atraumatic ?Neck: no adenopathy, supple, symmetrical, trachea midline and thyroid normal to inspection and palpation ?Lungs: clear to auscultation  bilaterally ?Cardiovascular: regular rate and rhythm ?Breasts: normal appearance, no masses or tenderness ?Abdomen: soft, non-tender; non distended,  no masses,  no organomegaly ?Extremities: extremities normal, atraumatic, no cyanosis or edema ?Skin: Skin color, texture, turgor normal. No rashes or lesions ?Lymph nodes: Cervical, supraclavicular, and axillary nodes normal. ?No abnormal inguinal nodes palpated ?Neurologic: Grossly normal ? ? ?Pelvic: External genitalia:  no lesions ?             Urethra:  normal appearing urethra with no masses, tenderness or lesions ?             Bartholins and Skenes: normal    ?             Vagina: normal appearing vagina with normal color and discharge, no lesions ?             Cervix: nabothian cyst ?              ?Bimanual Exam:  Uterus:  normal  size, contour, position, consistency, mobility, non-tender ?             Adnexa: no mass, fullness, tenderness ?              Rectovaginal: Confirms ?              Anus:  normal sphincter tone, no lesions ? ?Gae Dry, CMA chaperoned for the exam. ? ?1. Well woman exam ?Mammogram and colonoscopy UTD ?Labs with primary ?Discussed breast self exam ?Discussed calcium and vit D intake ? ?2. Screening for cervical cancer ?- Cytology - PAP ? ? ? ?

## 2021-06-05 ENCOUNTER — Ambulatory Visit (INDEPENDENT_AMBULATORY_CARE_PROVIDER_SITE_OTHER): Payer: No Typology Code available for payment source | Admitting: Obstetrics and Gynecology

## 2021-06-05 ENCOUNTER — Encounter: Payer: Self-pay | Admitting: Obstetrics and Gynecology

## 2021-06-05 ENCOUNTER — Other Ambulatory Visit (HOSPITAL_COMMUNITY)
Admission: RE | Admit: 2021-06-05 | Discharge: 2021-06-05 | Disposition: A | Discharge status: Home / Self Care | Payer: No Typology Code available for payment source | Source: Ambulatory Visit | Attending: Obstetrics and Gynecology | Admitting: Obstetrics and Gynecology

## 2021-06-05 VITALS — BP 128/74 | HR 80 | Ht 61.5 in | Wt 126.0 lb

## 2021-06-05 DIAGNOSIS — Z124 Encounter for screening for malignant neoplasm of cervix: Secondary | ICD-10-CM | POA: Insufficient documentation

## 2021-06-05 DIAGNOSIS — Z01419 Encounter for gynecological examination (general) (routine) without abnormal findings: Secondary | ICD-10-CM

## 2021-06-05 NOTE — Patient Instructions (Signed)

## 2021-06-06 LAB — CYTOLOGY - PAP
Comment: NEGATIVE
Diagnosis: NEGATIVE
High risk HPV: NEGATIVE

## 2021-06-30 ENCOUNTER — Other Ambulatory Visit: Payer: Self-pay | Admitting: Physician Assistant

## 2021-09-28 ENCOUNTER — Other Ambulatory Visit: Payer: Self-pay | Admitting: Physician Assistant

## 2021-11-13 ENCOUNTER — Encounter: Payer: Self-pay | Admitting: *Deleted

## 2021-11-29 ENCOUNTER — Encounter: Payer: Self-pay | Admitting: Physician Assistant

## 2021-11-29 ENCOUNTER — Ambulatory Visit (INDEPENDENT_AMBULATORY_CARE_PROVIDER_SITE_OTHER): Payer: No Typology Code available for payment source | Admitting: Physician Assistant

## 2021-11-29 VITALS — BP 132/88 | HR 82 | Temp 97.8°F | Ht 61.0 in | Wt 127.2 lb

## 2021-11-29 DIAGNOSIS — I10 Essential (primary) hypertension: Secondary | ICD-10-CM | POA: Diagnosis not present

## 2021-11-29 DIAGNOSIS — Z1283 Encounter for screening for malignant neoplasm of skin: Secondary | ICD-10-CM

## 2021-11-29 DIAGNOSIS — Z23 Encounter for immunization: Secondary | ICD-10-CM | POA: Diagnosis not present

## 2021-11-29 DIAGNOSIS — R21 Rash and other nonspecific skin eruption: Secondary | ICD-10-CM

## 2021-11-29 DIAGNOSIS — E041 Nontoxic single thyroid nodule: Secondary | ICD-10-CM | POA: Diagnosis not present

## 2021-11-29 DIAGNOSIS — Z0000 Encounter for general adult medical examination without abnormal findings: Secondary | ICD-10-CM | POA: Diagnosis not present

## 2021-11-29 DIAGNOSIS — E782 Mixed hyperlipidemia: Secondary | ICD-10-CM

## 2021-11-29 LAB — CBC WITH DIFFERENTIAL/PLATELET
Basophils Absolute: 0.1 10*3/uL (ref 0.0–0.1)
Basophils Relative: 1.3 % (ref 0.0–3.0)
Eosinophils Absolute: 0.2 10*3/uL (ref 0.0–0.7)
Eosinophils Relative: 3.6 % (ref 0.0–5.0)
HCT: 44 % (ref 36.0–46.0)
Hemoglobin: 14.9 g/dL (ref 12.0–15.0)
Lymphocytes Relative: 29.5 % (ref 12.0–46.0)
Lymphs Abs: 1.6 10*3/uL (ref 0.7–4.0)
MCHC: 33.9 g/dL (ref 30.0–36.0)
MCV: 93.2 fl (ref 78.0–100.0)
Monocytes Absolute: 0.4 10*3/uL (ref 0.1–1.0)
Monocytes Relative: 7.6 % (ref 3.0–12.0)
Neutro Abs: 3.2 10*3/uL (ref 1.4–7.7)
Neutrophils Relative %: 58 % (ref 43.0–77.0)
Platelets: 313 10*3/uL (ref 150.0–400.0)
RBC: 4.72 Mil/uL (ref 3.87–5.11)
RDW: 12.8 % (ref 11.5–15.5)
WBC: 5.5 10*3/uL (ref 4.0–10.5)

## 2021-11-29 LAB — COMPREHENSIVE METABOLIC PANEL
ALT: 15 U/L (ref 0–35)
AST: 19 U/L (ref 0–37)
Albumin: 4.7 g/dL (ref 3.5–5.2)
Alkaline Phosphatase: 54 U/L (ref 39–117)
BUN: 13 mg/dL (ref 6–23)
CO2: 34 mEq/L — ABNORMAL HIGH (ref 19–32)
Calcium: 10.7 mg/dL — ABNORMAL HIGH (ref 8.4–10.5)
Chloride: 102 mEq/L (ref 96–112)
Creatinine, Ser: 0.79 mg/dL (ref 0.40–1.20)
GFR: 82.18 mL/min (ref >60.00)
Glucose, Bld: 95 mg/dL (ref 70–99)
Potassium: 5.6 mEq/L — ABNORMAL HIGH (ref 3.5–5.1)
Sodium: 144 mEq/L (ref 135–145)
Total Bilirubin: 0.9 mg/dL (ref 0.2–1.2)
Total Protein: 7.5 g/dL (ref 6.0–8.3)

## 2021-11-29 LAB — TSH: TSH: 2.17 u[IU]/mL (ref 0.35–5.50)

## 2021-11-29 LAB — LIPID PANEL
Cholesterol: 300 mg/dL — ABNORMAL HIGH (ref 0–200)
HDL: 112.6 mg/dL (ref >39.00)
LDL Cholesterol: 169 mg/dL — ABNORMAL HIGH (ref 0–99)
NonHDL: 187.79
Total CHOL/HDL Ratio: 3
Triglycerides: 93 mg/dL (ref 0.0–149.0)
VLDL: 18.6 mg/dL (ref 0.0–40.0)

## 2021-11-29 NOTE — Patient Instructions (Addendum)
Great to see you today Keep up good work! Flu shot and labs today  Call if needing any refills on lotrisone cream / lotion.   Please call the [facility] to schedule your mammogram.  [phone]

## 2021-11-29 NOTE — Progress Notes (Signed)
Subjective:    Patient ID: Shari Cook, female    DOB: 1960-03-10, 61 y.o.   MRN: [id number]  Chief Complaint  Patient presents with   Annual Exam    Pt in for annual CPE; requesting overall skin check in physical to be included to look for any skin issues; wants to discuss a medication previously prescribed and discussed.    HPI Patient is in today for annual exam. She is UTD with preventive health. Flu shot and fasting labs today.   Past Medical History:  Diagnosis Date   Allergy    GERD (gastroesophageal reflux disease) unsure   History of chicken pox    Hyperlipidemia    Hypertension    Recurrent upper respiratory infection (URI)    Status post dilation and curettage     Past Surgical History:  Procedure Laterality Date   ADENOIDECTOMY     DILATION AND CURETTAGE OF UTERUS     TONSILLECTOMY     WISDOM TOOTH EXTRACTION      Family History  Problem Relation Age of Onset   Arthritis Mother    Cancer Mother    Diabetes Mother    Early death Father    Heart disease Father    Hypertension Father    Heart attack Father    Kidney disease Sister    Diabetes Maternal Grandmother    Stroke Son     Social History   Tobacco Use   Smoking status: Never   Smokeless tobacco: Never  Vaping Use   Vaping Use: Never used  Substance Use Topics   Alcohol use: Yes    Comment: one or two drinks/month if we ever eat out again!   Drug use: Never     Allergies  Allergen Reactions   Statins Other (See Comments)    Muscle pain w/statin drugs    Review of Systems NEGATIVE UNLESS OTHERWISE INDICATED IN HPI      Objective:     BP 132/88 (BP Location: Left Arm)   Pulse 82   Temp 97.8 F (36.6 C) (Temporal)   Ht '5\' 1"'$  (1.549 m)   Wt 127 lb 3.2 oz (57.7 kg)   LMP 10/20/2017   SpO2 95%   BMI 24.03 kg/m   Wt Readings from Last 3 Encounters:  11/29/21 127 lb 3.2 oz (57.7 kg)  06/05/21 126 lb (57.2 kg)  03/07/21 126 lb 3.2 oz (57.2 kg)    BP Readings  from Last 3 Encounters:  11/29/21 132/88  06/05/21 128/74  03/07/21 139/83     Physical Exam Vitals and nursing note reviewed.  Constitutional:      Appearance: Normal appearance. She is normal weight. She is not toxic-appearing.  HENT:     Head: Normocephalic and atraumatic.     Right Ear: Tympanic membrane, ear canal and external ear normal.     Left Ear: Tympanic membrane, ear canal and external ear normal.     Nose: Nose normal.     Mouth/Throat:     Mouth: Mucous membranes are moist.  Eyes:     Extraocular Movements: Extraocular movements intact.     Conjunctiva/sclera: Conjunctivae normal.     Pupils: Pupils are equal, round, and reactive to light.  Neck:     Thyroid: No thyroid tenderness.     Comments: Small left thyroid nodule noted, previously known and evaluated Cardiovascular:     Rate and Rhythm: Normal rate and regular rhythm.     Pulses: Normal pulses.  Heart sounds: Normal heart sounds.  Pulmonary:     Effort: Pulmonary effort is normal.     Breath sounds: Normal breath sounds.  Abdominal:     General: Abdomen is flat. Bowel sounds are normal.     Palpations: Abdomen is soft. There is no mass.     Tenderness: There is no abdominal tenderness. There is no right CVA tenderness, left CVA tenderness or guarding.  Musculoskeletal:        General: Normal range of motion.     Cervical back: Normal range of motion and neck supple.     Right lower leg: No edema.     Left lower leg: No edema.  Skin:    General: Skin is warm and dry.     Comments: Slightly raised small rough dermatitis posterior neck - see photo.  Small raised oval flesh colored nevus along left scapula - see photo.  Neurological:     General: No focal deficit present.     Mental Status: She is alert and oriented to person, place, and time.  Psychiatric:        Mood and Affect: Mood normal.        Behavior: Behavior normal.        Thought Content: Thought content normal.        Judgment:  Judgment normal.            Assessment & Plan:  Encounter for annual physical exam Assessment & Plan: Age-appropriate screening and counseling performed today. Will check labs and call with results. Preventive measures discussed and printed in AVS for patient.   Patient Counseling: '[x]'$   Nutrition: Stressed importance of moderation in sodium/caffeine intake, saturated fat and cholesterol, caloric balance, sufficient intake of fresh fruits, vegetables, and fiber.  '[x]'$   Stressed the importance of regular exercise.   '[]'$   Substance Abuse: Discussed cessation/primary prevention of tobacco, alcohol, or other drug use; driving or other dangerous activities under the influence; availability of treatment for abuse.   '[]'$   Injury prevention: Discussed safety belts, safety helmets, smoke detector, smoking near bedding or upholstery.   '[]'$   Sexuality: Discussed sexually transmitted diseases, partner selection, use of condoms, avoidance of unintended pregnancy  and contraceptive alternatives.   '[x]'$   Dental health: Discussed importance of regular tooth brushing, flossing, and dental visits.  '[x]'$   Health maintenance and immunizations reviewed. Please refer to Health maintenance section.      Orders: -     CBC with Differential/Platelet -     Comprehensive metabolic panel -     Lipid panel -     TSH  Hyperlipidemia, mixed Assessment & Plan: Recheck fasting lipid panel today Cannot tolerate statins   Orders: -     Lipid panel  Benign essential HTN Assessment & Plan: BP stable, to goal, cont HCTZ 25 mg daily, monitor at home   Orders: -     CBC with Differential/Platelet -     Comprehensive metabolic panel  Thyroid nodule Assessment & Plan: [place] 01/2021  IMPRESSION: 1. Significant interval decrease in size of previously biopsied nodule in the left mid gland. Involution over time is consistent with a prior benign pathologic diagnosis. 2. No other nodules of concern.  Orders: -      TSH  Skin exam, screening for cancer  Rash and nonspecific skin eruption Assessment & Plan: Lotrisone lotion as needed for scalp. Lotrisone cream as needed for dermatitis of skin.    Need for immunization against influenza -  Flu Vaccine QUAD 90moIM (Fluarix, Fluzone & Alfiuria Quad PF)       Return in about 1 year (around 11/30/2022) for CPE, sking exam, labs.  This note was prepared with assistance of [employer] analyst Occasional wrong-word or sound-a-like substitutions may have occurred due to the inherent limitations of voice recognition software.    Zyden Suman M Chalee Hirota, PA-C

## 2021-11-29 NOTE — Assessment & Plan Note (Signed)
BP stable, to goal, cont HCTZ 25 mg daily, monitor at home

## 2021-11-29 NOTE — Assessment & Plan Note (Signed)
Lotrisone lotion as needed for scalp. Lotrisone cream as needed for dermatitis of skin.

## 2021-11-29 NOTE — Assessment & Plan Note (Signed)
Recheck fasting lipid panel today Cannot tolerate statins

## 2021-11-29 NOTE — Assessment & Plan Note (Signed)
[place] 01/2021  IMPRESSION: 1. Significant interval decrease in size of previously biopsied nodule in the left mid gland. Involution over time is consistent with a prior benign pathologic diagnosis. 2. No other nodules of concern.

## 2021-11-29 NOTE — Assessment & Plan Note (Signed)
Age-appropriate screening and counseling performed today. Will check labs and call with results. Preventive measures discussed and printed in AVS for patient.   Patient Counseling: [x]  Nutrition: Stressed importance of moderation in sodium/caffeine intake, saturated fat and cholesterol, caloric balance, sufficient intake of fresh fruits, vegetables, and fiber.  [x]  Stressed the importance of regular exercise.   []  Substance Abuse: Discussed cessation/primary prevention of tobacco, alcohol, or other drug use; driving or other dangerous activities under the influence; availability of treatment for abuse.   []  Injury prevention: Discussed safety belts, safety helmets, smoke detector, smoking near bedding or upholstery.   []  Sexuality: Discussed sexually transmitted diseases, partner selection, use of condoms, avoidance of unintended pregnancy  and contraceptive alternatives.   [x]  Dental health: Discussed importance of regular tooth brushing, flossing, and dental visits.  [x]  Health maintenance and immunizations reviewed. Please refer to Health maintenance section.     

## 2021-11-30 ENCOUNTER — Other Ambulatory Visit: Payer: Self-pay | Admitting: Obstetrics and Gynecology

## 2021-11-30 DIAGNOSIS — Z1231 Encounter for screening mammogram for malignant neoplasm of breast: Secondary | ICD-10-CM

## 2021-12-01 ENCOUNTER — Encounter: Payer: Self-pay | Admitting: Physician Assistant

## 2021-12-01 NOTE — Telephone Encounter (Signed)
Please see pt msg regarding lab results and advise

## 2021-12-04 ENCOUNTER — Other Ambulatory Visit: Payer: Self-pay

## 2021-12-04 DIAGNOSIS — Z0000 Encounter for general adult medical examination without abnormal findings: Secondary | ICD-10-CM

## 2021-12-15 ENCOUNTER — Other Ambulatory Visit (INDEPENDENT_AMBULATORY_CARE_PROVIDER_SITE_OTHER): Payer: No Typology Code available for payment source

## 2021-12-15 DIAGNOSIS — Z0000 Encounter for general adult medical examination without abnormal findings: Secondary | ICD-10-CM | POA: Diagnosis not present

## 2021-12-15 LAB — COMPREHENSIVE METABOLIC PANEL WITH GFR
ALT: 14 U/L (ref 0–35)
AST: 14 U/L (ref 0–37)
Albumin: 4.4 g/dL (ref 3.5–5.2)
Alkaline Phosphatase: 51 U/L (ref 39–117)
BUN: 13 mg/dL (ref 6–23)
CO2: 34 meq/L — ABNORMAL HIGH (ref 19–32)
Calcium: 9.5 mg/dL (ref 8.4–10.5)
Chloride: 100 meq/L (ref 96–112)
Creatinine, Ser: 0.73 mg/dL (ref 0.40–1.20)
GFR: 90.32 mL/min
Glucose, Bld: 107 mg/dL — ABNORMAL HIGH (ref 70–99)
Potassium: 3.4 meq/L — ABNORMAL LOW (ref 3.5–5.1)
Sodium: 140 meq/L (ref 135–145)
Total Bilirubin: 0.8 mg/dL (ref 0.2–1.2)
Total Protein: 6.8 g/dL (ref 6.0–8.3)

## 2021-12-18 ENCOUNTER — Encounter: Payer: Self-pay | Admitting: Physician Assistant

## 2021-12-19 ENCOUNTER — Telehealth: Payer: Self-pay | Admitting: Physician Assistant

## 2021-12-19 NOTE — Telephone Encounter (Signed)
Pt states: -Returning a phone call about labs.  -she will be avialable after 1:00pm today 12/19/21   Pt requests: -Return call

## 2021-12-20 ENCOUNTER — Other Ambulatory Visit: Payer: Self-pay | Admitting: Physician Assistant

## 2021-12-20 NOTE — Telephone Encounter (Signed)
See lab notes; pt returned Zamari call and was advised lab results per lab note

## 2022-01-24 ENCOUNTER — Ambulatory Visit
Admission: RE | Admit: 2022-01-24 | Discharge: 2022-01-24 | Disposition: A | Discharge status: Home / Self Care | Payer: No Typology Code available for payment source | Source: Ambulatory Visit | Attending: Obstetrics and Gynecology | Admitting: Obstetrics and Gynecology

## 2022-01-24 DIAGNOSIS — Z1231 Encounter for screening mammogram for malignant neoplasm of breast: Secondary | ICD-10-CM

## 2022-04-03 ENCOUNTER — Telehealth: Payer: Self-pay | Admitting: Physician Assistant

## 2022-04-03 ENCOUNTER — Other Ambulatory Visit: Payer: Self-pay

## 2022-04-03 MED ORDER — HYDROCHLOROTHIAZIDE 25 MG PO TABS: 25.0000 mg | ORAL_TABLET | Freq: Every day | ORAL | 3 refills | Status: DC

## 2022-04-03 NOTE — Telephone Encounter (Signed)
  Encourage patient to contact the pharmacy for refills or they can request refills through [place]:  Please schedule appointment if longer than 1 year  NEXT APPOINTMENT DATE:  MEDICATION:  hydrochlorothiazide (HYDRODIURIL) 25 MG tablet   Is the patient out of medication? YES  [facility] [place] [place] [facility]   Let patient know to contact pharmacy at the end of the day to make sure medication is ready.  Please notify patient to allow 48-72 hours to process

## 2022-04-03 NOTE — Telephone Encounter (Signed)
Rx sent to pharmacy pt requested 

## 2022-07-04 ENCOUNTER — Ambulatory Visit: Payer: 59 | Admitting: Obstetrics and Gynecology

## 2022-07-12 NOTE — Progress Notes (Signed)
62 y.o. G2P0 Married White or Caucasian Not Hispanic or Latino female here for annual exam.  No vaginal bleeding.     Patient's last menstrual period was 10/20/2017.          Sexually active: No.  The current method of family planning is post menopausal status.    Exercising: Yes.     Stretching and walking daily  Smoker:  no  Health Maintenance: Pap: 06/05/21 WNL Hr HPV neg; 07/04/17 negative, negative HR HPV History of abnormal Pap:  no MMG:  01/24/22 Bi-rads 1 neg  BMD:   n/a Colonoscopy: 05/13/13 f/u 10 years  TDaP:  08/06/19 Gardasil: n/a   reports that she has never smoked. She has never used smokeless tobacco. She reports current alcohol use. She reports that she does not use drugs. 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 2 grown kids (out of [place]). Granddaughter is 47.87 years old.    Past Medical History:  Diagnosis Date   Allergy    GERD (gastroesophageal reflux disease) unsure   History of chicken pox    Hyperlipidemia    Hypertension    Recurrent upper respiratory infection (URI)    Status post dilation and curettage     Past Surgical History:  Procedure Laterality Date   ADENOIDECTOMY     DILATION AND CURETTAGE OF UTERUS     TONSILLECTOMY     WISDOM TOOTH EXTRACTION      Current Outpatient Medications  Medication Sig Dispense Refill   Calcium-Magnesium-Vitamin D (CALCIUM MAGNESIUM PO) Take by mouth.     clotrimazole-betamethasone (LOTRISONE) cream Apply 1 application topically 2 (two) times daily. 30 g 2   clotrimazole-betamethasone (LOTRISONE) lotion Apply topically 2 (two) times daily. 30 mL 1   Garlic 200 MG TABS Take by mouth.     Glucosamine 500 MG CAPS Take by mouth.     hydrochlorothiazide (HYDRODIURIL) 25 MG tablet Take 1 tablet (25 mg total) by mouth daily. 90 tablet 3   ketoconazole (NIZORAL) 2 % shampoo Use twice a week 100 mL 2   Misc Natural Products (LUTEIN 20 PO) Take 1 tablet by mouth daily.      Multiple  Vitamins-Minerals ([facility] SPECTRAVITE ADULT 50+ PO) Take by mouth.     Omega-3 Fatty Acids (FISH OIL PO) Take by mouth.     No current facility-administered medications for this visit.    Family History  Problem Relation Age of Onset   Arthritis Mother    Cancer Mother    Diabetes Mother    Early death Father    Heart disease Father    Hypertension Father    Heart attack Father    Kidney disease Sister    Diabetes Maternal Grandmother    Stroke Son     Review of Systems  All other systems reviewed and are negative.   Exam:   BP 120/82   Pulse 83   Ht 5\' 1"  (1.549 m)   Wt 132 lb (59.9 kg)   LMP 10/20/2017   SpO2 100%   BMI 24.94 kg/m   Weight change: @WEIGHTCHANGE @ Height:   Height: 5\' 1"  (154.9 cm)  Ht Readings from Last 3 Encounters:  07/18/22 5\' 1"  (1.549 m)  11/29/21 5\' 1"  (1.549 m)  06/05/21 5' 1.5" (1.562 m)    General appearance: alert, cooperative and appears stated age Head: Normocephalic, without obvious abnormality, atraumatic Neck: no adenopathy, supple, symmetrical, trachea midline and thyroid normal to inspection and palpation Lungs: clear  to auscultation bilaterally Cardiovascular: regular rate and rhythm Breasts: normal appearance, no masses or tenderness Abdomen: soft, non-tender; non distended,  no masses,  no organomegaly Extremities: extremities normal, atraumatic, no cyanosis or edema Skin: Skin color, texture, turgor normal. No rashes or lesions Lymph nodes: Cervical, supraclavicular, and axillary nodes normal. No abnormal inguinal nodes palpated Neurologic: Grossly normal   Pelvic: External genitalia:  no lesions              Urethra:  normal appearing urethra with no masses, tenderness or lesions              Bartholins and Skenes: normal                 Vagina: normal appearing vagina with normal color and discharge, no lesions              Cervix: no lesions               Bimanual Exam:  Uterus:  normal size, contour, position,  consistency, mobility, non-tender              Adnexa: no mass, fullness, tenderness               Rectovaginal: Confirms               Anus:  normal sphincter tone, no lesions  Carolynn Serve, CMA chaperoned for the exam.  1. Well woman exam No pap this year Mammogram and colonoscopy UTD Labs UTD Discussed breast self exam Discussed calcium and vit D intake

## 2022-07-18 ENCOUNTER — Encounter: Payer: Self-pay | Admitting: Obstetrics and Gynecology

## 2022-07-18 ENCOUNTER — Ambulatory Visit (INDEPENDENT_AMBULATORY_CARE_PROVIDER_SITE_OTHER): Payer: 59 | Admitting: Obstetrics and Gynecology

## 2022-07-18 VITALS — BP 120/82 | HR 83 | Ht 61.0 in | Wt 132.0 lb

## 2022-07-18 DIAGNOSIS — Z01419 Encounter for gynecological examination (general) (routine) without abnormal findings: Secondary | ICD-10-CM

## 2022-07-18 NOTE — Patient Instructions (Signed)

## 2022-08-09 DIAGNOSIS — H40013 Open angle with borderline findings, low risk, bilateral: Secondary | ICD-10-CM | POA: Diagnosis not present

## 2022-08-22 ENCOUNTER — Encounter: Payer: Self-pay | Admitting: Physician Assistant

## 2022-08-22 ENCOUNTER — Other Ambulatory Visit: Payer: Self-pay

## 2022-08-22 ENCOUNTER — Other Ambulatory Visit: Payer: Self-pay | Admitting: Physician Assistant

## 2022-08-22 MED ORDER — BETAMETHASONE DIPROPIONATE 0.05 % EX CREA: TOPICAL_CREAM | Freq: Two times a day (BID) | CUTANEOUS | 0 refills | Status: AC

## 2022-08-22 MED ORDER — CLOTRIMAZOLE-BETAMETHASONE 1-0.05 % EX CREA: 1.0000 | TOPICAL_CREAM | Freq: Two times a day (BID) | CUTANEOUS | 2 refills | Status: DC

## 2022-08-22 NOTE — Telephone Encounter (Signed)
Please see pt msg this is the only Rx on file or in patient history.

## 2022-08-22 NOTE — Telephone Encounter (Signed)
Ok to send for patient?  

## 2022-12-12 ENCOUNTER — Other Ambulatory Visit: Payer: Self-pay | Admitting: Physician Assistant

## 2022-12-12 DIAGNOSIS — Z1231 Encounter for screening mammogram for malignant neoplasm of breast: Secondary | ICD-10-CM

## 2023-01-23 ENCOUNTER — Ambulatory Visit (INDEPENDENT_AMBULATORY_CARE_PROVIDER_SITE_OTHER): Payer: 59 | Admitting: Physician Assistant

## 2023-01-23 ENCOUNTER — Encounter: Payer: Self-pay | Admitting: Physician Assistant

## 2023-01-23 VITALS — BP 129/83 | HR 76 | Temp 98.0°F | Ht 61.0 in | Wt 134.0 lb

## 2023-01-23 DIAGNOSIS — Z0000 Encounter for general adult medical examination without abnormal findings: Secondary | ICD-10-CM | POA: Diagnosis not present

## 2023-01-23 DIAGNOSIS — Z131 Encounter for screening for diabetes mellitus: Secondary | ICD-10-CM

## 2023-01-23 DIAGNOSIS — E782 Mixed hyperlipidemia: Secondary | ICD-10-CM | POA: Diagnosis not present

## 2023-01-23 DIAGNOSIS — R21 Rash and other nonspecific skin eruption: Secondary | ICD-10-CM

## 2023-01-23 NOTE — Assessment & Plan Note (Signed)
Recheck fasting lipid panel today Cannot tolerate statins CAC score of 0 in 2019  She is open to rechecking CAC score if cholesterol still elevated &/or referral to Dr. Rennis Golden

## 2023-01-23 NOTE — Patient Instructions (Addendum)
Try Tubby Todd ointment for dry skin patches  Reschedule with new GYN at [facility] as your provider has retired  Keep up good work!

## 2023-01-23 NOTE — Progress Notes (Signed)
Patient ID: Shari Cook, female    DOB: [birth year], 62 y.o.   MRN: [id number]   Assessment & Plan:  Encounter for annual physical exam -     CBC with Differential/Platelet -     Comprehensive metabolic panel -     Lipid panel -     TSH -     Hemoglobin A1c  Hyperlipidemia, mixed Assessment & Plan: Recheck fasting lipid panel today Cannot tolerate statins CAC score of 0 in 2019  She is open to rechecking CAC score if cholesterol still elevated &/or referral to Dr. Rennis Golden   Orders: -     Lipid panel  Rash and nonspecific skin eruption   Age-appropriate screening and counseling performed today. Will check labs and call with results. Preventive measures discussed and printed in AVS for patient.   Patient Counseling: [x]   Nutrition: Stressed importance of moderation in sodium/caffeine intake, saturated fat and cholesterol, caloric balance, sufficient intake of fresh fruits, vegetables, and fiber.  [x]   Stressed the importance of regular exercise.   []   Substance Abuse: Discussed cessation/primary prevention of tobacco, alcohol, or other drug use; driving or other dangerous activities under the influence; availability of treatment for abuse.   []   Injury prevention: Discussed safety belts, safety helmets, smoke detector, smoking near bedding or upholstery.   []   Sexuality: Discussed sexually transmitted diseases, partner selection, use of condoms, avoidance of unintended pregnancy  and contraceptive alternatives.   [x]   Dental health: Discussed importance of regular tooth brushing, flossing, and dental visits.  [x]   Health maintenance and immunizations reviewed. Please refer to Health maintenance section.          Return in about 1 year (around 01/23/2024) for physical, fasting labs .    Subjective:    Chief Complaint  Patient presents with   Annual Exam    Patient is fasting for labs; pt aware Colonoscopy due soon and will call to schedule.     HPI Patient  is in today for annual exam.  Acute concerns: None   Health maintenance: Lifestyle/ exercise: 2- 20 min walks / day, stretching  Nutrition: well-balanced Mental health: doing well  Sleep: doing ok Substance use: none Immunizations: UTD Colonoscopy: she will schedule Pap: UTD Mammogram: UTD - next week  DEXA: she will talk with GYN to schedule in the next year or two   Skin: patch around her gluteal cleft that keeps coming back; resolves with Vaseline daily   Past Medical History:  Diagnosis Date   Allergy    GERD (gastroesophageal reflux disease) unsure   History of chicken pox    Hyperlipidemia    Hypertension    Recurrent upper respiratory infection (URI)    Status post dilation and curettage     Past Surgical History:  Procedure Laterality Date   ADENOIDECTOMY     DILATION AND CURETTAGE OF UTERUS     TONSILLECTOMY     WISDOM TOOTH EXTRACTION      Family History  Problem Relation Age of Onset   Arthritis Mother    Cancer Mother    Diabetes Mother    Early death Father    Heart disease Father    Hypertension Father    Heart attack Father    Kidney disease Sister    Diabetes Maternal Grandmother    Stroke Son     Social History   Tobacco Use   Smoking status: Never   Smokeless tobacco: Never  Vaping Use   Vaping  status: Never Used  Substance Use Topics   Alcohol use: Yes    Comment: one or two drinks/month   Drug use: Never     Allergies  Allergen Reactions   Statins Other (See Comments)    Muscle pain w/statin drugs    Review of Systems NEGATIVE UNLESS OTHERWISE INDICATED IN HPI      Objective:     BP 129/83   Pulse 76   Temp 98 F (36.7 C) (Temporal)   Ht 5\' 1"  (1.549 m)   Wt 134 lb (60.8 kg)   LMP 10/20/2017   SpO2 98%   BMI 25.32 kg/m   Wt Readings from Last 3 Encounters:  01/23/23 134 lb (60.8 kg)  07/18/22 132 lb (59.9 kg)  11/29/21 127 lb 3.2 oz (57.7 kg)    BP Readings from Last 3 Encounters:  01/23/23 129/83   07/18/22 120/82  11/29/21 132/88     Physical Exam Vitals and nursing note reviewed.  Constitutional:      Appearance: Normal appearance. She is normal weight. She is not toxic-appearing.  HENT:     Head: Normocephalic and atraumatic.     Right Ear: Tympanic membrane, ear canal and external ear normal.     Left Ear: Tympanic membrane, ear canal and external ear normal.     Nose: Nose normal.     Mouth/Throat:     Mouth: Mucous membranes are moist.  Eyes:     Extraocular Movements: Extraocular movements intact.     Conjunctiva/sclera: Conjunctivae normal.     Pupils: Pupils are equal, round, and reactive to light.  Cardiovascular:     Rate and Rhythm: Normal rate and regular rhythm.     Pulses: Normal pulses.     Heart sounds: Normal heart sounds.  Pulmonary:     Effort: Pulmonary effort is normal.     Breath sounds: Normal breath sounds.  Abdominal:     General: Abdomen is flat. Bowel sounds are normal.     Palpations: Abdomen is soft.  Musculoskeletal:        General: Normal range of motion.     Cervical back: Normal range of motion and neck supple.  Skin:    General: Skin is warm and dry.  Neurological:     General: No focal deficit present.     Mental Status: She is alert and oriented to person, place, and time.  Psychiatric:        Mood and Affect: Mood normal.        Behavior: Behavior normal.        Thought Content: Thought content normal.        Judgment: Judgment normal.        Joao Mccurdy M Louan [employer], PA-C

## 2023-01-24 LAB — COMPREHENSIVE METABOLIC PANEL
ALT: 22 U/L (ref 0–35)
AST: 25 U/L (ref 0–37)
Albumin: 4.5 g/dL (ref 3.5–5.2)
Alkaline Phosphatase: 45 U/L (ref 39–117)
BUN: 12 mg/dL (ref 6–23)
CO2: 34 meq/L — ABNORMAL HIGH (ref 19–32)
Calcium: 10.1 mg/dL (ref 8.4–10.5)
Chloride: 102 meq/L (ref 96–112)
Creatinine, Ser: 0.71 mg/dL (ref 0.40–1.20)
GFR: 92.66 mL/min (ref >60.00)
Glucose, Bld: 79 mg/dL (ref 70–99)
Potassium: 4.9 meq/L (ref 3.5–5.1)
Sodium: 142 meq/L (ref 135–145)
Total Bilirubin: 1 mg/dL (ref 0.2–1.2)
Total Protein: 7 g/dL (ref 6.0–8.3)

## 2023-01-24 LAB — CBC WITH DIFFERENTIAL/PLATELET
Basophils Absolute: 0.1 10*3/uL (ref 0.0–0.1)
Basophils Relative: 1.2 % (ref 0.0–3.0)
Eosinophils Absolute: 0.3 10*3/uL (ref 0.0–0.7)
Eosinophils Relative: 5.1 % — ABNORMAL HIGH (ref 0.0–5.0)
HCT: 44.9 % (ref 36.0–46.0)
Hemoglobin: 15.4 g/dL — ABNORMAL HIGH (ref 12.0–15.0)
Lymphocytes Relative: 32.9 % (ref 12.0–46.0)
Lymphs Abs: 1.6 10*3/uL (ref 0.7–4.0)
MCHC: 34.2 g/dL (ref 30.0–36.0)
MCV: 93.7 fL (ref 78.0–100.0)
Monocytes Absolute: 0.4 10*3/uL (ref 0.1–1.0)
Monocytes Relative: 8.2 % (ref 3.0–12.0)
Neutro Abs: 2.6 10*3/uL (ref 1.4–7.7)
Neutrophils Relative %: 52.6 % (ref 43.0–77.0)
Platelets: 293 10*3/uL (ref 150.0–400.0)
RBC: 4.79 Mil/uL (ref 3.87–5.11)
RDW: 13 % (ref 11.5–15.5)
WBC: 5 10*3/uL (ref 4.0–10.5)

## 2023-01-24 LAB — LIPID PANEL
Cholesterol: 291 mg/dL — ABNORMAL HIGH (ref 0–200)
HDL: 108.6 mg/dL (ref >39.00)
LDL Cholesterol: 166 mg/dL — ABNORMAL HIGH (ref 0–99)
NonHDL: 182.24
Total CHOL/HDL Ratio: 3
Triglycerides: 80 mg/dL (ref 0.0–149.0)
VLDL: 16 mg/dL (ref 0.0–40.0)

## 2023-01-24 LAB — HEMOGLOBIN A1C: Hgb A1c MFr Bld: 5.7 % (ref 4.6–6.5)

## 2023-01-24 LAB — TSH: TSH: 2.19 u[IU]/mL (ref 0.35–5.50)

## 2023-01-29 ENCOUNTER — Ambulatory Visit
Admission: RE | Admit: 2023-01-29 | Discharge: 2023-01-29 | Disposition: A | Discharge status: Home / Self Care | Payer: 59 | Source: Ambulatory Visit | Attending: Physician Assistant | Admitting: Physician Assistant

## 2023-01-29 DIAGNOSIS — Z1231 Encounter for screening mammogram for malignant neoplasm of breast: Secondary | ICD-10-CM | POA: Diagnosis not present

## 2023-01-31 ENCOUNTER — Encounter: Payer: Self-pay | Admitting: Physician Assistant

## 2023-02-01 ENCOUNTER — Encounter: Payer: Self-pay | Admitting: Physician Assistant

## 2023-02-04 NOTE — Telephone Encounter (Signed)
Please see patient message and advise.

## 2023-02-06 ENCOUNTER — Other Ambulatory Visit: Payer: Self-pay

## 2023-02-06 ENCOUNTER — Other Ambulatory Visit: Payer: Self-pay | Admitting: Physician Assistant

## 2023-02-06 DIAGNOSIS — E782 Mixed hyperlipidemia: Secondary | ICD-10-CM

## 2023-02-06 DIAGNOSIS — E7800 Pure hypercholesterolemia, unspecified: Secondary | ICD-10-CM

## 2023-02-06 NOTE — Telephone Encounter (Signed)
Please see patient response and advise 

## 2023-03-18 ENCOUNTER — Other Ambulatory Visit: Payer: Self-pay

## 2023-03-18 MED ORDER — HYDROCHLOROTHIAZIDE 25 MG PO TABS: 25.0000 mg | ORAL_TABLET | Freq: Every day | ORAL | 3 refills | Status: DC

## 2023-04-05 ENCOUNTER — Ambulatory Visit (HOSPITAL_BASED_OUTPATIENT_CLINIC_OR_DEPARTMENT_OTHER)
Admission: RE | Admit: 2023-04-05 | Discharge: 2023-04-05 | Disposition: A | Discharge status: Home / Self Care | Payer: Self-pay | Source: Ambulatory Visit | Attending: Physician Assistant | Admitting: Physician Assistant

## 2023-04-05 DIAGNOSIS — E7800 Pure hypercholesterolemia, unspecified: Secondary | ICD-10-CM | POA: Insufficient documentation

## 2023-04-05 DIAGNOSIS — E782 Mixed hyperlipidemia: Secondary | ICD-10-CM | POA: Insufficient documentation

## 2023-04-08 ENCOUNTER — Encounter: Payer: Self-pay | Admitting: Physician Assistant

## 2023-04-09 NOTE — Telephone Encounter (Signed)
Please advise on referral for Colonoscopy and Rx to be sent for patient

## 2023-04-10 ENCOUNTER — Other Ambulatory Visit: Payer: Self-pay

## 2023-04-10 ENCOUNTER — Other Ambulatory Visit: Payer: Self-pay | Admitting: Physician Assistant

## 2023-04-10 DIAGNOSIS — Z1211 Encounter for screening for malignant neoplasm of colon: Secondary | ICD-10-CM

## 2023-04-10 DIAGNOSIS — E782 Mixed hyperlipidemia: Secondary | ICD-10-CM

## 2023-04-10 DIAGNOSIS — E7800 Pure hypercholesterolemia, unspecified: Secondary | ICD-10-CM

## 2023-04-10 MED ORDER — PRAVASTATIN SODIUM 20 MG PO TABS: 20.0000 mg | ORAL_TABLET | Freq: Every day | ORAL | 2 refills | Status: DC

## 2023-04-18 ENCOUNTER — Encounter: Payer: Self-pay | Admitting: Gastroenterology

## 2023-05-15 ENCOUNTER — Encounter: Payer: Self-pay | Admitting: Physician Assistant

## 2023-05-15 ENCOUNTER — Other Ambulatory Visit: Payer: Self-pay | Admitting: Physician Assistant

## 2023-05-15 DIAGNOSIS — E7800 Pure hypercholesterolemia, unspecified: Secondary | ICD-10-CM

## 2023-05-15 DIAGNOSIS — E782 Mixed hyperlipidemia: Secondary | ICD-10-CM

## 2023-05-15 NOTE — Telephone Encounter (Signed)
 Copied from CRM [id number]. Topic: Clinical - Medication Refill >> May 15, 2023 11:37 AM Elizebeth Brooking wrote: Most Recent Primary Care Visit:  Provider: Bary Leriche  Department: [facility]  Visit Type: PHYSICAL  Date: 01/23/2023  Medication: pravastatin (PRAVACHOL) 20 MG tablet  Has the patient contacted their pharmacy? Yes (Agent: If no, request that the patient contact the pharmacy for the refill. If patient does not wish to contact the pharmacy document the reason why and proceed with request.) (Agent: If yes, when and what did the pharmacy advise?)  Is this the correct pharmacy for this prescription? Yes If no, delete pharmacy and type the correct one.  This is the patient's preferred pharmacy:   [id number] [place], [place] - [address] [place] [address] Phone: [phone] Fax: [phone]   Has the prescription been filled recently? No  Is the patient out of the medication? Yes  Has the patient been seen for an appointment in the last year OR does the patient have an upcoming appointment? Yes  Can we respond through MyChart? Yes  Agent: Please be advised that Rx refills may take up to 3 business days. We ask that you follow-up with your pharmacy.

## 2023-05-15 NOTE — Telephone Encounter (Signed)
 Patient has 2 refills available on prescription, [facility] Prescription sent on 2/19/255. Cancelled reorder as patient has 2 refills; closing encounter.

## 2023-05-17 ENCOUNTER — Other Ambulatory Visit: Payer: Self-pay

## 2023-05-17 ENCOUNTER — Encounter: Payer: Self-pay | Admitting: Physician Assistant

## 2023-05-17 ENCOUNTER — Telehealth: Payer: Self-pay

## 2023-05-17 DIAGNOSIS — E7800 Pure hypercholesterolemia, unspecified: Secondary | ICD-10-CM

## 2023-05-17 DIAGNOSIS — E782 Mixed hyperlipidemia: Secondary | ICD-10-CM

## 2023-05-17 MED ORDER — PRAVASTATIN SODIUM 20 MG PO TABS: 20.0000 mg | ORAL_TABLET | Freq: Every day | ORAL | 2 refills | Status: DC

## 2023-05-17 NOTE — Telephone Encounter (Signed)
 Please see pt request and advise patient

## 2023-05-17 NOTE — Telephone Encounter (Signed)
 Copied from CRM [id number]. Topic: Clinical - Prescription Issue >> May 17, 2023 11:23 AM Efraim Kaufmann C wrote: Reason for CRM: patient called pharmacy and they have not received refill request for pravastatin (PRAVACHOL) 20 MG table. Patient said she had stated she wanted [facility] to be [facility] [place], [place] - [address] [facility] AT Portal to Registered [address] [place], [place] [address] Phone: [phone]  Fax: [phone]  Please advise.  [facility] patient request. Sent patient MyChart message to advise this has been sent.

## 2023-05-20 ENCOUNTER — Other Ambulatory Visit: Payer: Self-pay

## 2023-05-20 MED ORDER — PRAVASTATIN SODIUM 20 MG PO TABS: 20.0000 mg | ORAL_TABLET | Freq: Every day | ORAL | 0 refills | Status: DC

## 2023-05-23 ENCOUNTER — Ambulatory Visit (AMBULATORY_SURGERY_CENTER): Payer: Self-pay

## 2023-05-23 VITALS — Ht 61.0 in | Wt 133.0 lb

## 2023-05-23 DIAGNOSIS — Z1211 Encounter for screening for malignant neoplasm of colon: Secondary | ICD-10-CM

## 2023-05-23 MED ORDER — SUFLAVE 178.7 G PO SOLR: 1.0000 | Freq: Once | ORAL | 0 refills | Status: AC

## 2023-05-23 NOTE — Progress Notes (Signed)

## 2023-06-05 ENCOUNTER — Encounter: Payer: Self-pay | Admitting: Physician Assistant

## 2023-06-05 NOTE — Telephone Encounter (Signed)
 Can you look at this and advise, thank you in advance

## 2023-06-10 ENCOUNTER — Telehealth: Payer: Self-pay | Admitting: Gastroenterology

## 2023-06-10 NOTE — Telephone Encounter (Signed)
 Returned patient call and clarified  diet pre colonoscopy and to hold NSAIDS until after procedure.  If needed Tylenol acceptable to take. Patient voiced understanding.

## 2023-06-10 NOTE — Telephone Encounter (Signed)
 Requesting call back to discuss prep instructions.

## 2023-06-12 ENCOUNTER — Encounter: Payer: Self-pay | Admitting: Gastroenterology

## 2023-06-14 ENCOUNTER — Encounter: Payer: Self-pay | Admitting: Gastroenterology

## 2023-06-14 ENCOUNTER — Ambulatory Visit (AMBULATORY_SURGERY_CENTER): Payer: Self-pay | Admitting: Gastroenterology

## 2023-06-14 VITALS — BP 109/61 | HR 81 | Temp 98.2°F | Resp 13 | Ht 61.0 in | Wt 133.0 lb

## 2023-06-14 DIAGNOSIS — D123 Benign neoplasm of transverse colon: Secondary | ICD-10-CM | POA: Diagnosis not present

## 2023-06-14 DIAGNOSIS — K648 Other hemorrhoids: Secondary | ICD-10-CM | POA: Diagnosis not present

## 2023-06-14 DIAGNOSIS — K644 Residual hemorrhoidal skin tags: Secondary | ICD-10-CM | POA: Diagnosis not present

## 2023-06-14 DIAGNOSIS — Z1211 Encounter for screening for malignant neoplasm of colon: Secondary | ICD-10-CM | POA: Diagnosis not present

## 2023-06-14 DIAGNOSIS — K635 Polyp of colon: Secondary | ICD-10-CM | POA: Diagnosis not present

## 2023-06-14 DIAGNOSIS — K5730 Diverticulosis of large intestine without perforation or abscess without bleeding: Secondary | ICD-10-CM | POA: Diagnosis not present

## 2023-06-14 DIAGNOSIS — I252 Old myocardial infarction: Secondary | ICD-10-CM | POA: Diagnosis not present

## 2023-06-14 DIAGNOSIS — E785 Hyperlipidemia, unspecified: Secondary | ICD-10-CM | POA: Diagnosis not present

## 2023-06-14 DIAGNOSIS — I10 Essential (primary) hypertension: Secondary | ICD-10-CM | POA: Diagnosis not present

## 2023-06-14 MED ORDER — SODIUM CHLORIDE 0.9 % IV SOLN: 500.0000 mL | INTRAVENOUS | Status: DC

## 2023-06-14 NOTE — Patient Instructions (Signed)
-  Handout on polyps, diverticulosis and hemorrhoids provided -await pathology results -repeat colonoscopy for surveillance in 5-10 years recommended  -Continue present medications    YOU HAD AN ENDOSCOPIC PROCEDURE TODAY AT THE [facility]:   Refer to the procedure report that was given to you for any specific questions about what was found during the examination.  If the procedure report does not answer your questions, please call your gastroenterologist to clarify.  If you requested that your care partner not be given the details of your procedure findings, then the procedure report has been included in a sealed envelope for you to review at your convenience later.  YOU SHOULD EXPECT: Some feelings of bloating in the abdomen. Passage of more gas than usual.  Walking can help get rid of the air that was put into your GI tract during the procedure and reduce the bloating. If you had a lower endoscopy (such as a colonoscopy or flexible sigmoidoscopy) you may notice spotting of blood in your stool or on the toilet paper. If you underwent a bowel prep for your procedure, you may not have a normal bowel movement for a few days.  Please Note:  You might notice some irritation and congestion in your nose or some drainage.  This is from the oxygen used during your procedure.  There is no need for concern and it should clear up in a day or so.  SYMPTOMS TO REPORT IMMEDIATELY:  Following lower endoscopy (colonoscopy or flexible sigmoidoscopy):  Excessive amounts of blood in the stool  Significant tenderness or worsening of abdominal pains  Swelling of the abdomen that is new, acute  Fever of 100F or higher  For urgent or emergent issues, a gastroenterologist can be reached at any hour by calling [phone]. Do not use MyChart messaging for urgent concerns.    DIET:  We do recommend a small meal at first, but then you may proceed to your regular diet.  Drink plenty of fluids but you  should avoid alcoholic beverages for 24 hours.  ACTIVITY:  You should plan to take it easy for the rest of today and you should NOT DRIVE or use heavy machinery until tomorrow (because of the sedation medicines used during the test).    FOLLOW UP: Our staff will call the number listed on your records the next business day following your procedure.  We will call around 7:15- 8:00 am to check on you and address any questions or concerns that you may have regarding the information given to you following your procedure. If we do not reach you, we will leave a message.     If any biopsies were taken you will be contacted by phone or by letter within the next 1-3 weeks.  Please call us  at [phone] if you have not heard about the biopsies in 3 weeks.    SIGNATURES/CONFIDENTIALITY: You and/or your care partner have signed paperwork which will be entered into your electronic medical record.  These signatures attest to the fact that that the information above on your After Visit Summary has been reviewed and is understood.  Full responsibility of the confidentiality of this discharge information lies with you and/or your care-partner.

## 2023-06-14 NOTE — Progress Notes (Signed)
 Report to PACU, RN, vss, BBS= Clear.

## 2023-06-14 NOTE — Progress Notes (Signed)
 Patient states there have been no changes to medical or surgical history since time of pre-visit.

## 2023-06-14 NOTE — Progress Notes (Signed)
 [name] History and Physical   Primary Care Physician:  Allwardt, Deleta Felix, PA-C   Reason for Procedure:  Colorectal cancer screening  Plan:    Screening colonoscopy with possible interventions as needed     HPI: Shari Cook is a very pleasant 63 y.o. female here for screening colonoscopy. Denies any nausea, vomiting, abdominal pain, melena or bright red blood per rectum  The risks and benefits as well as alternatives of endoscopic procedure(s) have been discussed and reviewed. All questions answered. The patient agrees to proceed.    Past Medical History:  Diagnosis Date   Allergy    GERD (gastroesophageal reflux disease) unsure   History of chicken pox    Hyperlipidemia    Hypertension    Myocardial infarction (HCC)    Recurrent upper respiratory infection (URI)    Status post dilation and curettage     Past Surgical History:  Procedure Laterality Date   ADENOIDECTOMY     DILATION AND CURETTAGE OF UTERUS     TONSILLECTOMY     WISDOM TOOTH EXTRACTION      Prior to Admission medications   Medication Sig Start Date End Date Taking? Authorizing Provider  Calcium-Magnesium-Vitamin D  (CALCIUM MAGNESIUM PO) Take by mouth.   Yes [provider]  Garlic 200 MG TABS Take by mouth.   Yes [provider]  Glucosamine-Chondroitin 1500-1200 MG/30ML LIQD    Yes [provider]  hydrochlorothiazide  (HYDRODIURIL ) 25 MG tablet Take 1 tablet (25 mg total) by mouth daily. 03/18/23  Yes Allwardt, Alyssa M, PA-C  Misc Natural Products (LUTEIN 20 PO) Take 1 tablet by mouth daily.    Yes [provider]  Multiple Vitamins-Minerals ([facility] SPECTRAVITE ADULT 50+ PO) Take by mouth.   Yes [provider]  pravastatin  (PRAVACHOL ) 20 MG tablet Take 1 tablet (20 mg total) by mouth daily. 05/20/23  Yes Allwardt, Alyssa M, PA-C  betamethasone  dipropionate 0.05 % cream Apply topically 2 (two) times daily. Apply thin layer to affected  areas. Do not apply consistently for more than 2 weeks at a time. 08/22/22   Allwardt, Alyssa M, PA-C  ketoconazole  (NIZORAL ) 2 % shampoo Use twice a week 11/24/20   Allwardt, Alyssa M, PA-C  Omega-3 Fatty Acids (FISH OIL PO) Take by mouth.    [provider]    Current Outpatient Medications  Medication Sig Dispense Refill   Calcium-Magnesium-Vitamin D  (CALCIUM MAGNESIUM PO) Take by mouth.     Garlic 200 MG TABS Take by mouth.     Glucosamine-Chondroitin 1500-1200 MG/30ML LIQD      hydrochlorothiazide  (HYDRODIURIL ) 25 MG tablet Take 1 tablet (25 mg total) by mouth daily. 90 tablet 3   Misc Natural Products (LUTEIN 20 PO) Take 1 tablet by mouth daily.      Multiple Vitamins-Minerals (CVS SPECTRAVITE ADULT 50+ PO) Take by mouth.     pravastatin  (PRAVACHOL ) 20 MG tablet Take 1 tablet (20 mg total) by mouth daily. 14 tablet 0   betamethasone  dipropionate 0.05 % cream Apply topically 2 (two) times daily. Apply thin layer to affected areas. Do not apply consistently for more than 2 weeks at a time. 45 g 0   ketoconazole  (NIZORAL ) 2 % shampoo Use twice a week 100 mL 2   Omega-3 Fatty Acids (FISH OIL PO) Take by mouth.     Current Facility-Administered Medications  Medication Dose Route Frequency Provider Last Rate Last Admin   0.9 %  sodium chloride infusion  500 mL Intravenous Continuous Layne Dilauro  V, MD        Allergies as of 06/14/2023 - Review Complete 06/14/2023  Allergen Reaction Noted   Statins Other (See Comments) 10/29/2018    Family History  Problem Relation Age of Onset   Arthritis Mother    Cancer Mother    Diabetes Mother    Early death Father    Heart disease Father    Hypertension Father    Heart attack Father    Kidney disease Sister    Diabetes Maternal Grandmother    Stroke Son    Colon cancer Neg Hx    Colon polyps Neg Hx    Esophageal cancer Neg Hx    Rectal cancer Neg Hx    Stomach cancer Neg Hx     Social History   Socioeconomic History    Marital status: Married    Spouse name: Not on file   Number of children: Not on file   Years of education: Not on file   Highest education level: Not on file  Occupational History   Not on file  Tobacco Use   Smoking status: Never   Smokeless tobacco: Never  Vaping Use   Vaping status: Never Used  Substance and Sexual Activity   Alcohol use: Yes    Comment: one or two drinks/month   Drug use: Never   Sexual activity: Not Currently    Birth control/protection: Post-menopausal  Other Topics Concern   Not on file  Social History Narrative   Not on file   Social Drivers of Health   Financial Resource Strain: Not on file  Food Insecurity: Not on file  Transportation Needs: Not on file  Physical Activity: Not on file  Stress: Not on file  Social Connections: Not on file  Intimate Partner Violence: Not on file    Review of Systems:  All other review of systems negative except as mentioned in the HPI.  Physical Exam: Vital signs in last 24 hours: BP (!) 161/80   Pulse 86   Temp 98.2 F (36.8 C) (Skin)   Ht 5\' 1"  (1.549 m)   Wt 133 lb (60.3 kg)   LMP 10/20/2017   SpO2 100%   BMI 25.13 kg/m  General:   Alert, NAD Lungs:  Clear .   Heart:  Regular rate and rhythm Abdomen:  Soft, nontender and nondistended. Neuro/Psych:  Alert and cooperative. Normal mood and affect. A and O x 3  Reviewed labs, radiology imaging, old records and pertinent past GI work up  Patient is appropriate for planned procedure(s) and anesthesia in an ambulatory setting   K. Veena Selassie Spatafore , MD [phone]

## 2023-06-14 NOTE — Op Note (Signed)
 [facility] Patient Name: Shari Cook Procedure Date: 06/14/2023 11:10 AM MRN: [id number] Endoscopist: Sergio Dandy , MD, [id number] Age: 63 Referring MD:  Date of Birth: 1961-01-13 Gender: Female Account #: [id number] Procedure:                Colonoscopy Indications:              Screening for colorectal malignant neoplasm Medicines:                Monitored Anesthesia Care Procedure:                Pre-Anesthesia Assessment:                           - Prior to the procedure, a History and Physical                            was performed, and patient medications and                            allergies were reviewed. The patient's tolerance of                            previous anesthesia was also reviewed. The risks                            and benefits of the procedure and the sedation                            options and risks were discussed with the patient.                            All questions were answered, and informed consent                            was obtained. Prior Anticoagulants: The patient has                            taken no anticoagulant or antiplatelet agents. ASA                            Grade Assessment: II - A patient with mild systemic                            disease. After reviewing the risks and benefits,                            the patient was deemed in satisfactory condition to                            undergo the procedure.                           After obtaining informed consent, the colonoscope  was passed under direct vision. Throughout the                            procedure, the patient's blood pressure, pulse, and                            oxygen saturations were monitored continuously. The                            [id number]-HQ190L Colonoscope [id number] was introduced                            through the anus and advanced to the the cecum,                             identified by appendiceal orifice and ileocecal                            valve. The colonoscopy was performed without                            difficulty. The patient tolerated the procedure                            well. The quality of the bowel preparation was                            good. The ileocecal valve, appendiceal orifice, and                            rectum were photographed. Scope In: 11:28:36 AM Scope Out: 11:46:55 AM Scope Withdrawal Time: 0 hours 12 minutes 9 seconds  Total Procedure Duration: 0 hours 18 minutes 19 seconds  Findings:                 The perianal and digital rectal examinations were                            normal.                           A 4 mm polyp was found in the transverse colon. The                            polyp was sessile. The polyp was removed with a                            cold snare. Resection and retrieval were complete.                           A few small-mouthed diverticula were found in the                            sigmoid colon.  Non-bleeding external and internal hemorrhoids were                            found during retroflexion. The hemorrhoids were                            medium-sized. Complications:            No immediate complications. Estimated Blood Loss:     Estimated blood loss was minimal. Impression:               - One 4 mm polyp in the transverse colon, removed                            with a cold snare. Resected and retrieved.                           - Diverticulosis in the sigmoid colon.                           - Non-bleeding external and internal hemorrhoids. Recommendation:           - Patient has a contact number available for                            emergencies. The signs and symptoms of potential                            delayed complications were discussed with the                            patient. Return to normal activities tomorrow.                             Written discharge instructions were provided to the                            patient.                           - Resume previous diet.                           - Continue present medications.                           - Await pathology results.                           - Repeat colonoscopy in 5-10 years for surveillance                            based on pathology results. Jahsiah Carpenter V. Whalen Trompeter, MD 06/14/2023 11:50:31 AM This report has been signed electronically.

## 2023-06-14 NOTE — Progress Notes (Signed)
 Called to room to assist during endoscopic procedure.  Patient ID and intended procedure confirmed with present staff. Received instructions for my participation in the procedure from the performing physician.

## 2023-06-17 ENCOUNTER — Telehealth: Payer: Self-pay

## 2023-06-17 NOTE — Telephone Encounter (Signed)
 Please see pt msg and advise recommendations

## 2023-06-17 NOTE — Telephone Encounter (Signed)
  Follow up Call-     06/14/2023   10:20 AM  Call back number  Post procedure Call Back phone  # [phone]  Permission to leave phone message Yes     Patient questions:  Do you have a fever, pain , or abdominal swelling? No. Pain Score  0 *  Have you tolerated food without any problems? Yes.    Have you been able to return to your normal activities? Yes.    Do you have any questions about your discharge instructions: Diet   No. Medications  No. Follow up visit  No.  Do you have questions or concerns about your Care? No.  Actions: * If pain score is 4 or above: No action needed, pain <4.

## 2023-06-19 LAB — SURGICAL PATHOLOGY

## 2023-06-21 ENCOUNTER — Telehealth: Payer: Self-pay

## 2023-06-21 NOTE — Telephone Encounter (Signed)
 Copied from CRM [id number]. Topic: General - Other >> Jun 21, 2023 11:08 AM Shari Cook T wrote: Reason for CRM: patient is needing a call back to schedule her measles  vaccine I was unable to schedule it in epic the schedule was not popping up for me patient would like a call back.  Called Shari Cook and advised to please check w/ insurance; Shari Cook states she called insurance and was advised this will be covered for her. Shari Cook insisted on appt to get MMR Booster; scheduled appt with patient and PCP approval. Shari Cook is aware if not covered by insurance she will be responsible for costs and Shari Cook verbalized understanding.

## 2023-06-25 ENCOUNTER — Ambulatory Visit

## 2023-06-25 DIAGNOSIS — Z23 Encounter for immunization: Secondary | ICD-10-CM

## 2023-07-02 ENCOUNTER — Encounter: Payer: Self-pay | Admitting: Gastroenterology

## 2023-07-03 ENCOUNTER — Ambulatory Visit: Payer: Self-pay | Admitting: Gastroenterology

## 2023-07-03 ENCOUNTER — Telehealth: Payer: Self-pay

## 2023-07-03 NOTE — Telephone Encounter (Signed)
 Patient is inquiring about her pathology report. She does communicate through My Chart.

## 2023-07-03 NOTE — Telephone Encounter (Signed)
 Sent letter through [employer]. Thanks

## 2023-07-22 ENCOUNTER — Encounter: Payer: Self-pay | Admitting: Nurse Practitioner

## 2023-07-22 ENCOUNTER — Ambulatory Visit (INDEPENDENT_AMBULATORY_CARE_PROVIDER_SITE_OTHER): Admitting: Nurse Practitioner

## 2023-07-22 VITALS — BP 120/72 | HR 81 | Ht 60.5 in | Wt 134.0 lb

## 2023-07-22 DIAGNOSIS — Z1331 Encounter for screening for depression: Secondary | ICD-10-CM | POA: Diagnosis not present

## 2023-07-22 DIAGNOSIS — Z01419 Encounter for gynecological examination (general) (routine) without abnormal findings: Secondary | ICD-10-CM

## 2023-07-22 DIAGNOSIS — N393 Stress incontinence (female) (male): Secondary | ICD-10-CM | POA: Diagnosis not present

## 2023-07-22 DIAGNOSIS — Z780 Asymptomatic menopausal state: Secondary | ICD-10-CM

## 2023-07-22 NOTE — Progress Notes (Signed)
 Shari Cook October 30, 1960 [id number]   History:  63 y.o. G2P0 presents for annual exam. Postmenopausal - no HRT. Normal pap history. HLD managed by PCP. H/O MI. Complains of stress incontinence. This is not new for her but feels it has progressed some over the years.   Gynecologic History Patient's last menstrual period was 10/20/2017.   Contraception/Family planning: post menopausal status Sexually active: Yes  Health Maintenance Last Pap: 06/05/2021. Results were: Normal neg HPV Last mammogram: 01/29/2023. Results were: Normal Last colonoscopy: 06/14/2023. Results were: Normal Last Dexa: Never     07/22/2023   10:29 AM  Depression screen PHQ 2/9  Decreased Interest 0  Down, Depressed, Hopeless 0  PHQ - 2 Score 0     Past medical history, past surgical history, family history and social history were all reviewed and documented in the EPIC chart. Married.   ROS:  A ROS was performed and pertinent positives and negatives are included.  Exam:  Vitals:   07/22/23 1024  BP: 120/72  Pulse: 81  SpO2: 98%  Weight: 134 lb (60.8 kg)  Height: 5' 0.5" (1.537 m)   Body mass index is 25.74 kg/m.  General appearance:  Normal Thyroid :  Symmetrical, normal in size, without palpable masses or nodularity. Respiratory  Auscultation:  Clear without wheezing or rhonchi Cardiovascular  Auscultation:  Regular rate, without rubs, murmurs or gallops  Edema/varicosities:  Not grossly evident Abdominal  Soft,nontender, without masses, guarding or rebound.  Liver/spleen:  No organomegaly noted  Hernia:  None appreciated  Skin  Inspection:  Grossly normal Breasts: Examined lying and sitting.   Right: Without masses, retractions, nipple discharge or axillary adenopathy.   Left: Without masses, retractions, nipple discharge or axillary adenopathy. Pelvic: External genitalia:  no lesions              Urethra:  normal appearing urethra with no masses, tenderness or lesions               Bartholins and Skenes: normal                 Vagina: normal appearing vagina with normal color and discharge, no lesions              Cervix: no lesions Bimanual Exam:  Uterus:  no masses or tenderness              Adnexa: no mass, fullness, tenderness              Rectovaginal: Deferred              Anus:  normal, no lesions  Doria Garden, NP student present as chaperone.   Assessment/Plan:  63 y.o. [name] for annual exam.   Well female exam with routine gynecological exam - Education provided on SBEs, importance of preventative screenings, current guidelines, high calcium diet, regular exercise, and multivitamin daily. Labs with PCP.   Postmenopausal - Plan: DG Bone Density. No HRT, no bleeding  Stress incontinence - with coughing, sneezing etc. This is not new for her but feels it has progressed some over the years. Discussed causes and management options. Will work on at home exercises. Will reach out if she decides to pursue pelvic floor PT.   Screening for cervical cancer - Normal Pap history.  Will repeat at 5-year interval per guidelines.  Screening for breast cancer - Normal mammogram history.  Continue annual screenings.  Normal breast exam today.  Screening for colon cancer - 05/2023. Will repeat at [facility]  recommended interval.   Return in about 1 year (around 07/21/2024) for Annual.   Shari Bamberger DNP, 10:52 AM 07/22/2023

## 2023-08-05 ENCOUNTER — Encounter: Payer: Self-pay | Admitting: Physician Assistant

## 2023-08-05 DIAGNOSIS — I10 Essential (primary) hypertension: Secondary | ICD-10-CM

## 2023-08-05 DIAGNOSIS — E782 Mixed hyperlipidemia: Secondary | ICD-10-CM

## 2023-08-05 MED ORDER — PRAVASTATIN SODIUM 20 MG PO TABS: 20.0000 mg | ORAL_TABLET | Freq: Every day | ORAL | 1 refills | Status: DC

## 2023-08-05 NOTE — Telephone Encounter (Signed)
 Spoke with patient about PCP notes. Scheduled for lab appt and sent in Rx for mail order.

## 2023-08-05 NOTE — Addendum Note (Signed)
 Addended by: Renae Mottley on: 08/05/2023 03:21 PM   Modules accepted: Orders

## 2023-08-06 ENCOUNTER — Other Ambulatory Visit (INDEPENDENT_AMBULATORY_CARE_PROVIDER_SITE_OTHER)

## 2023-08-06 DIAGNOSIS — I10 Essential (primary) hypertension: Secondary | ICD-10-CM | POA: Diagnosis not present

## 2023-08-06 DIAGNOSIS — E782 Mixed hyperlipidemia: Secondary | ICD-10-CM

## 2023-08-06 LAB — LIPID PANEL
Cholesterol: 258 mg/dL — ABNORMAL HIGH (ref 0–200)
HDL: 109.8 mg/dL (ref >39.00)
LDL Cholesterol: 132 mg/dL — ABNORMAL HIGH (ref 0–99)
NonHDL: 148.53
Total CHOL/HDL Ratio: 2
Triglycerides: 82 mg/dL (ref 0.0–149.0)
VLDL: 16.4 mg/dL (ref 0.0–40.0)

## 2023-08-06 LAB — COMPREHENSIVE METABOLIC PANEL WITH GFR
ALT: 16 U/L (ref 0–35)
AST: 20 U/L (ref 0–37)
Albumin: 4.6 g/dL (ref 3.5–5.2)
Alkaline Phosphatase: 49 U/L (ref 39–117)
BUN: 15 mg/dL (ref 6–23)
CO2: 32 meq/L (ref 19–32)
Calcium: 9.6 mg/dL (ref 8.4–10.5)
Chloride: 99 meq/L (ref 96–112)
Creatinine, Ser: 0.71 mg/dL (ref 0.40–1.20)
GFR: 92.31 mL/min (ref >60.00)
Glucose, Bld: 87 mg/dL (ref 70–99)
Potassium: 3.6 meq/L (ref 3.5–5.1)
Sodium: 139 meq/L (ref 135–145)
Total Bilirubin: 0.9 mg/dL (ref 0.2–1.2)
Total Protein: 7.2 g/dL (ref 6.0–8.3)

## 2023-08-12 ENCOUNTER — Ambulatory Visit: Payer: Self-pay | Admitting: Physician Assistant

## 2023-08-12 DIAGNOSIS — H40013 Open angle with borderline findings, low risk, bilateral: Secondary | ICD-10-CM | POA: Diagnosis not present

## 2023-08-27 ENCOUNTER — Ambulatory Visit: Admitting: Internal Medicine

## 2023-09-05 DIAGNOSIS — L814 Other melanin hyperpigmentation: Secondary | ICD-10-CM | POA: Diagnosis not present

## 2023-09-20 ENCOUNTER — Ambulatory Visit (INDEPENDENT_AMBULATORY_CARE_PROVIDER_SITE_OTHER): Admitting: Internal Medicine

## 2023-09-20 VITALS — BP 120/80 | HR 83

## 2023-09-20 DIAGNOSIS — E042 Nontoxic multinodular goiter: Secondary | ICD-10-CM

## 2023-09-20 LAB — TSH: TSH: 1.68 m[IU]/L (ref 0.40–4.50)

## 2023-09-20 LAB — T3, FREE: T3, Free: 3.4 pg/mL (ref 2.3–4.2)

## 2023-09-20 LAB — T4, FREE: Free T4: 1.2 ng/dL (ref 0.8–1.8)

## 2023-09-20 NOTE — Progress Notes (Signed)
 Name: Shari Cook  MRN/ DOB: [id number], 1960/12/30    Age/ Sex: 63 y.o., female     PCP: Allwardt, Mardy HERO, PA-C   Reason for Endocrinology Evaluation: MNG     Initial Endocrinology Clinic Visit: 12/30/2018    PATIENT IDENTIFIER: Shari Cook is a 63 y.o., female with a past medical history of HTn and MNG. She has followed with [facility] since 12/30/2018 for consultative assistance with management of her MNG.   HISTORICAL SUMMARY:   Pt was noted to have thyromegaly during routine examination at her dentist's office, she was advised to follow up with PCP which prompted an ultrasound in 12/2018, she is status post FNA of the left mid nodule with benign cytology on 01/20/2019.   No radiation exposure  Denies depression and anxiety    No Fh of thyroid  disease    SUBJECTIVE:    Today (09/20/2023):  Shari Cook is here for MNG.  Denies local neck symptoms , no dysphagia or pain  Denies palpitations  Denies tremors  Denies constipation or diarrhea  No biotin     HISTORY:  Past Medical History:  Past Medical History:  Diagnosis Date   Allergy    GERD (gastroesophageal reflux disease) unsure   History of chicken pox    Hyperlipidemia    Hypertension    Myocardial infarction (HCC)    Recurrent upper respiratory infection (URI)    Status post dilation and curettage    Past Surgical History:  Past Surgical History:  Procedure Laterality Date   ADENOIDECTOMY     DILATION AND CURETTAGE OF UTERUS     TONSILLECTOMY     WISDOM TOOTH EXTRACTION     Social History:  reports that she has never smoked. She has never used smokeless tobacco. She reports that she does not currently use alcohol. She reports that she does not use drugs. Family History:  Family History  Problem Relation Age of Onset   Arthritis Mother    Cancer Mother    Diabetes Mother    Early death Father    Heart disease Father    Hypertension Father    Heart attack  Father    Kidney disease Sister    Diabetes Maternal Grandmother    Stroke Son      HOME MEDICATIONS: Allergies as of 09/20/2023       Reactions   Statins Other (See Comments)   Muscle pain w/statin drugs        Medication List        Accurate as of September 20, 2023  7:14 AM. If you have any questions, ask your nurse or doctor.          betamethasone  dipropionate 0.05 % cream Apply topically 2 (two) times daily. Apply thin layer to affected areas. Do not apply consistently for more than 2 weeks at a time.   CALCIUM MAGNESIUM PO Take by mouth.   [employer] SPECTRAVITE ADULT 50+ PO Take by mouth.   FISH OIL PO Take by mouth.   Garlic 200 MG Tabs Take by mouth.   Glucosamine-Chondroitin 1500-1200 MG/30ML Liqd   hydrochlorothiazide  25 MG tablet Commonly known as: HYDRODIURIL  Take 1 tablet (25 mg total) by mouth daily.   ketoconazole  2 % shampoo Commonly known as: NIZORAL  Use twice a week What changed:  when to take this reasons to take this   LUTEIN 20 PO Take 1 tablet by mouth daily.   pravastatin  20 MG tablet Commonly known as:  PRAVACHOL  Take 1 tablet (20 mg total) by mouth daily.          OBJECTIVE:   PHYSICAL EXAM: VS: LMP 10/20/2017    EXAM: General: Pt appears well and is in NAD  Neck: General: Supple without adenopathy. Thyroid : Thyroid  size normal.  No goiter or nodules appreciated.  Lungs: Clear with good BS bilat   Heart: Auscultation: RRR.  Abdomen: soft, nontender  Extremities:  BL LE: No pretibial edema   Mental Status: Judgment, insight: Intact Orientation: Oriented to time, place, and person Mood and affect: No depression, anxiety, or agitation     DATA REVIEWED:  Latest Reference Range & Units 09/20/23 11:04  TSH 0.40 - 4.50 mIU/L 1.68  Triiodothyronine,Free,Serum 2.3 - 4.2 pg/mL 3.4  T4,Free(Direct) 0.8 - 1.8 ng/dL 1.2      ASSESSMENT / PLAN / RECOMMENDATIONS:   MNG:  -Patient is clinically euthyroid -No local  neck symptoms - She is S/P benign FNA of the left mid nodule with benign cytology on 01/20/2019. - Her last ultrasound showed both nodules decreased in size  - Will proceed with repeat thyroid  ultrasound, if thyroid  nodules remain stable, no further follow-up will be needed - We discussed that if her nodules become symptomatic the option would be surgery versus radiofrequency ablation - TFTs remain within normal range   F/U as needed  Signed electronically by: Stefano Redgie Butts, MD  [facility]  [facility] [address] [place] [place], [place] [address] Phone: [phone] FAX: [phone]      CC: Allwardt, Mardy HERO, PA-C [address] [place] [address] Phone: [phone]  Fax: [phone]   Return to Endocrinology clinic as below: Future Appointments  Date Time Provider Department Center  09/20/2023 10:50 AM Maleea Camilo, Donell Redgie, MD [facility] None  01/29/2024 10:00 AM Allwardt, Mardy HERO, PA-C [facility]

## 2023-09-23 ENCOUNTER — Ambulatory Visit: Payer: Self-pay | Admitting: Internal Medicine

## 2023-09-24 ENCOUNTER — Ambulatory Visit
Admission: RE | Admit: 2023-09-24 | Discharge: 2023-09-24 | Disposition: A | Discharge status: Home / Self Care | Source: Ambulatory Visit | Attending: Internal Medicine | Admitting: Internal Medicine

## 2023-09-24 DIAGNOSIS — E042 Nontoxic multinodular goiter: Secondary | ICD-10-CM

## 2023-09-24 DIAGNOSIS — E041 Nontoxic single thyroid nodule: Secondary | ICD-10-CM | POA: Diagnosis not present

## 2023-10-09 ENCOUNTER — Encounter: Payer: Self-pay | Admitting: Physician Assistant

## 2023-10-10 IMAGING — MG MM DIGITAL DIAGNOSTIC UNILAT*L* W/ TOMO W/ CAD
4 series · 4 of 12 positions shown · non-contrast
Comparison: Previous exam(s).

CLINICAL DATA: Patient recalled from screening for possible left
breast asymmetry.

EXAM:
DIGITAL DIAGNOSTIC UNILATERAL LEFT MAMMOGRAM WITH TOMOSYNTHESIS AND
CAD; ULTRASOUND LEFT BREAST LIMITED
TECHNIQUE: Left digital diagnostic mammography and breast tomosynthesis was
performed. The images were evaluated with computer-aided detection.;
Targeted ultrasound examination of the left breast was performed.

[L MLO synth-2D]
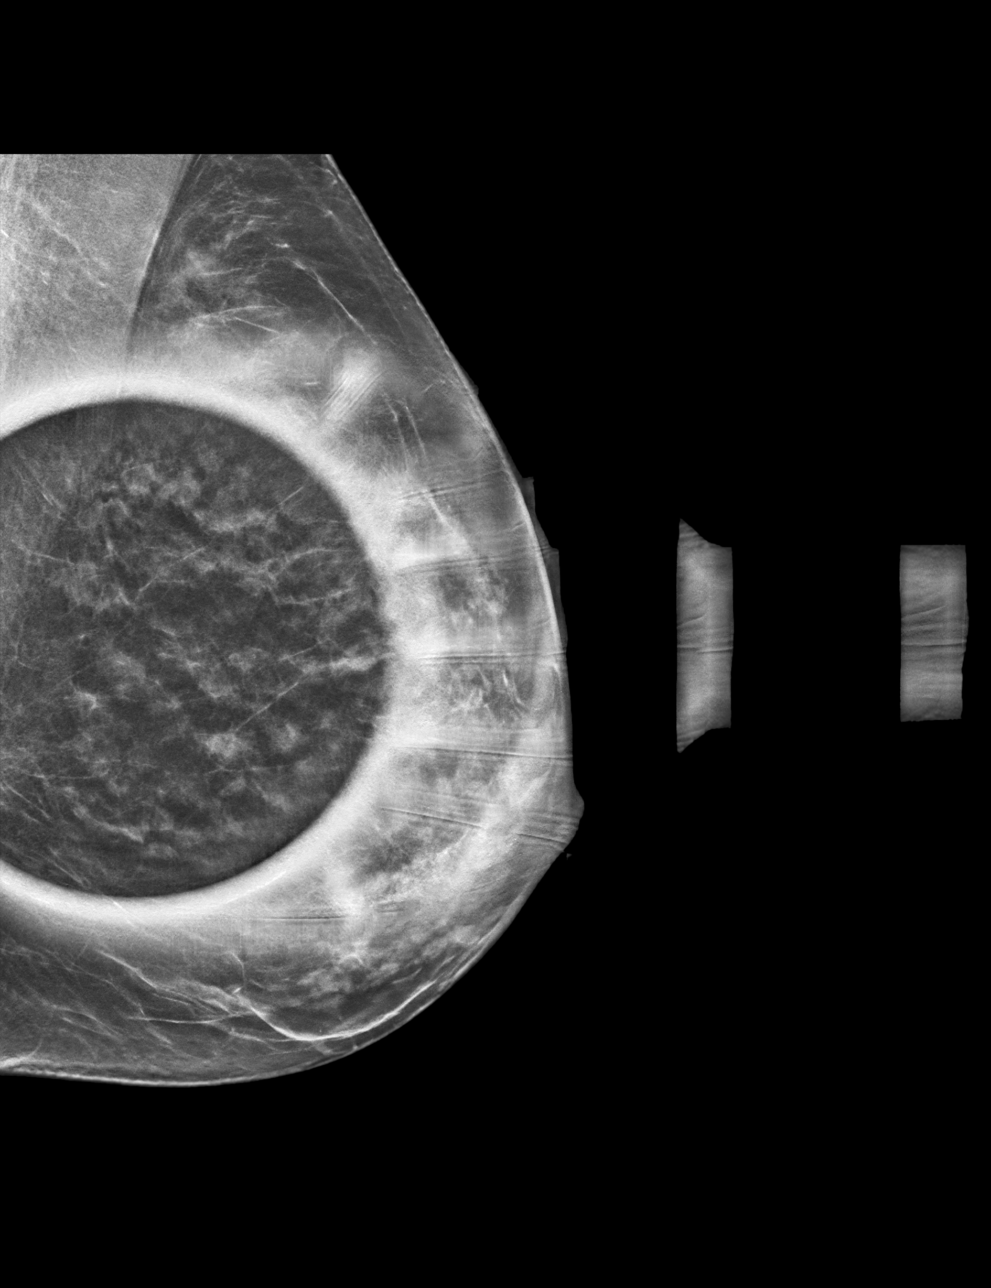

[L ML synth-2D]
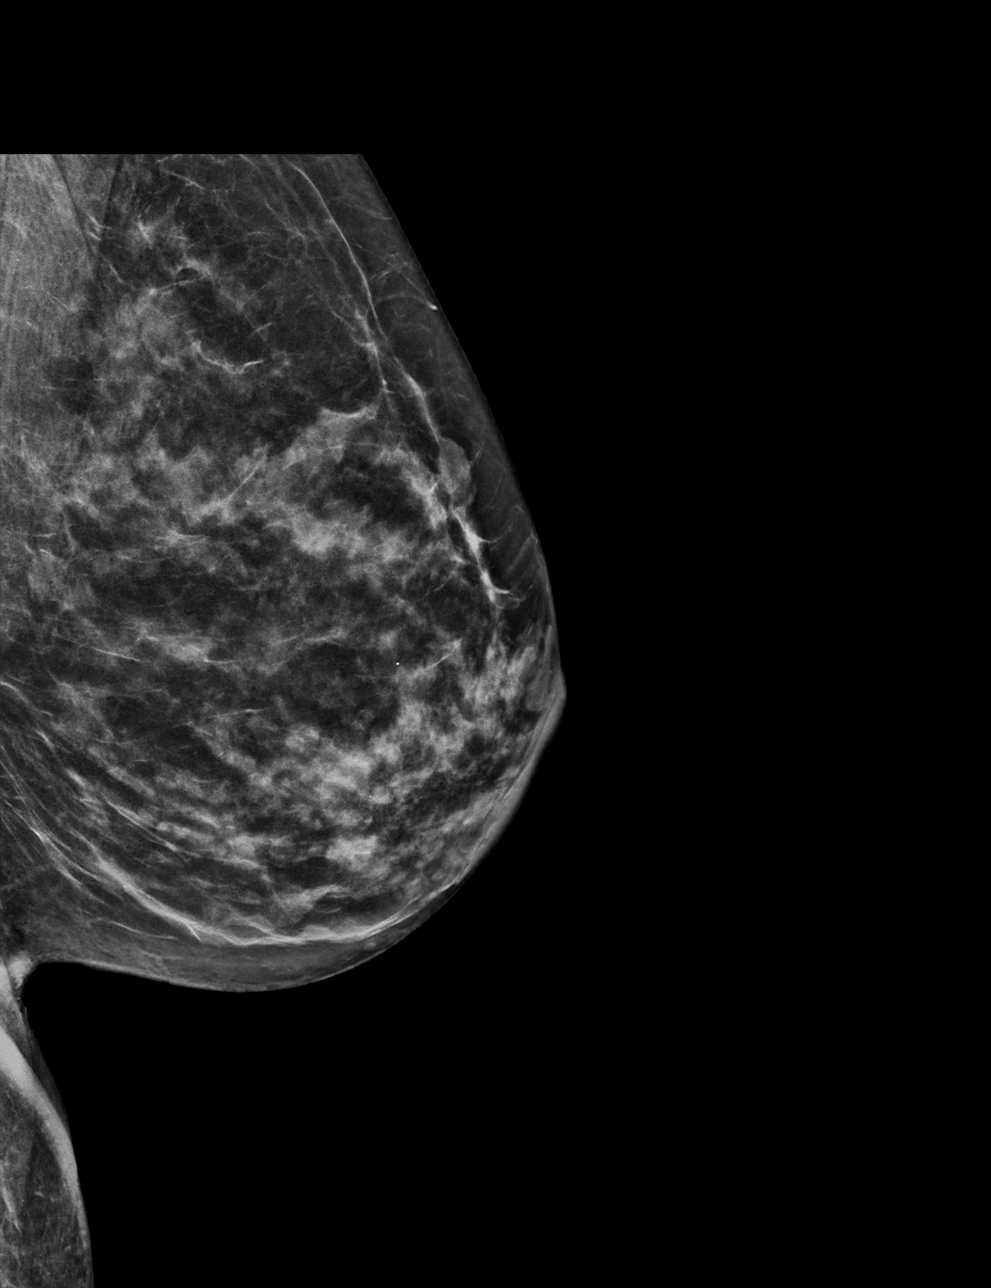

[L MLO tomo · tomo slice 24/47.0]
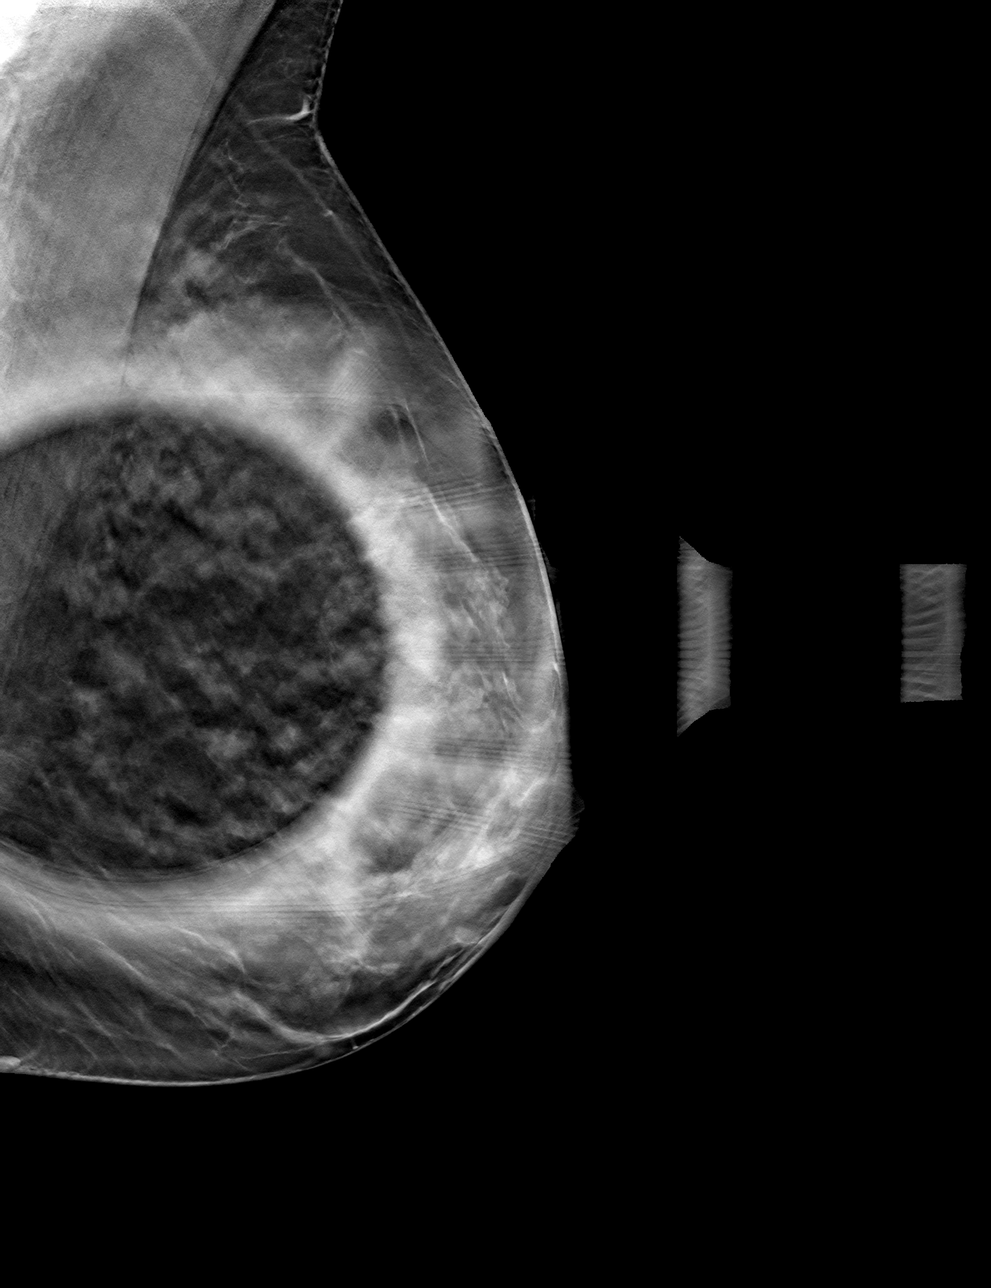

[L ML tomo · tomo slice 29/57.0]
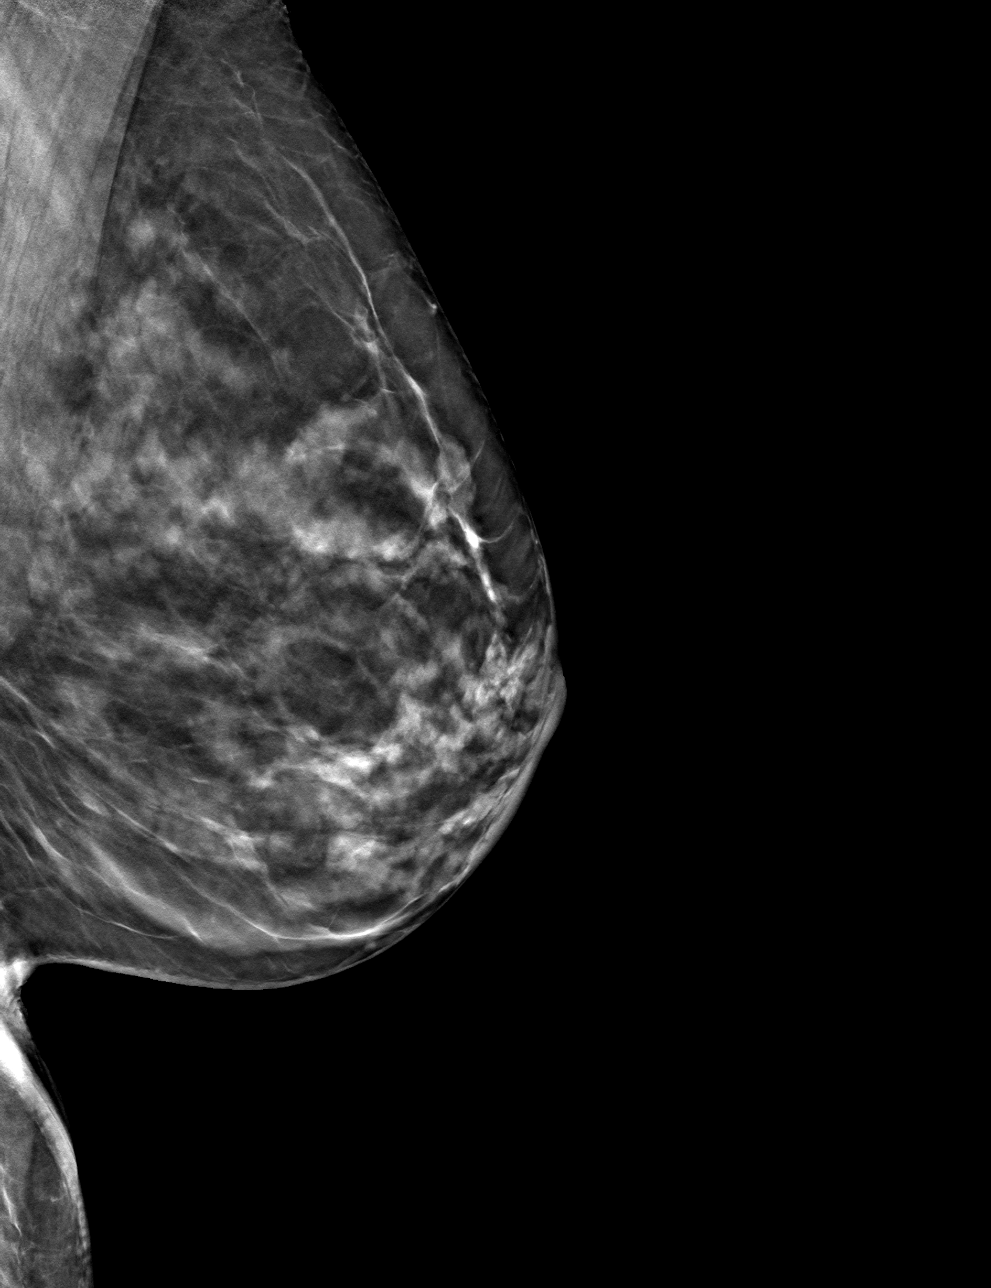

[4 of 12 positions shown; findings below may reference images not displayed]

ACR Breast Density Category c: The breast tissue is heterogeneously
dense, which may obscure small masses.
FINDINGS: Questioned asymmetry within the posterolateral left breast appear to
predominately effaced with additional imaging suggestive of dense
fibroglandular tissue.

Targeted ultrasound is performed, showing normal tissue without
suspicious mass within the outer left breast posterior depth.
IMPRESSION: No mammographic evidence for malignancy.

RECOMMENDATION:
Screening mammogram in one year.(Code:51-Y-KX4)

I have discussed the findings and recommendations with the patient.
If applicable, a reminder letter will be sent to the patient
regarding the next appointment.

BI-RADS CATEGORY  2: Benign.

## 2023-10-10 IMAGING — US US BREAST*L* LIMITED INC AXILLA
1 series · 3 of 3 positions shown · non-contrast
Comparison: Previous exam(s).

CLINICAL DATA: Patient recalled from screening for possible left
breast asymmetry.

EXAM:
DIGITAL DIAGNOSTIC UNILATERAL LEFT MAMMOGRAM WITH TOMOSYNTHESIS AND
CAD; ULTRASOUND LEFT BREAST LIMITED
TECHNIQUE: Left digital diagnostic mammography and breast tomosynthesis was
performed. The images were evaluated with computer-aided detection.;
Targeted ultrasound examination of the left breast was performed.

[Series 1: us breast*left* limited inc axilla · 0.06mm/px · 3 of 3 slices shown]
[im 1/3]
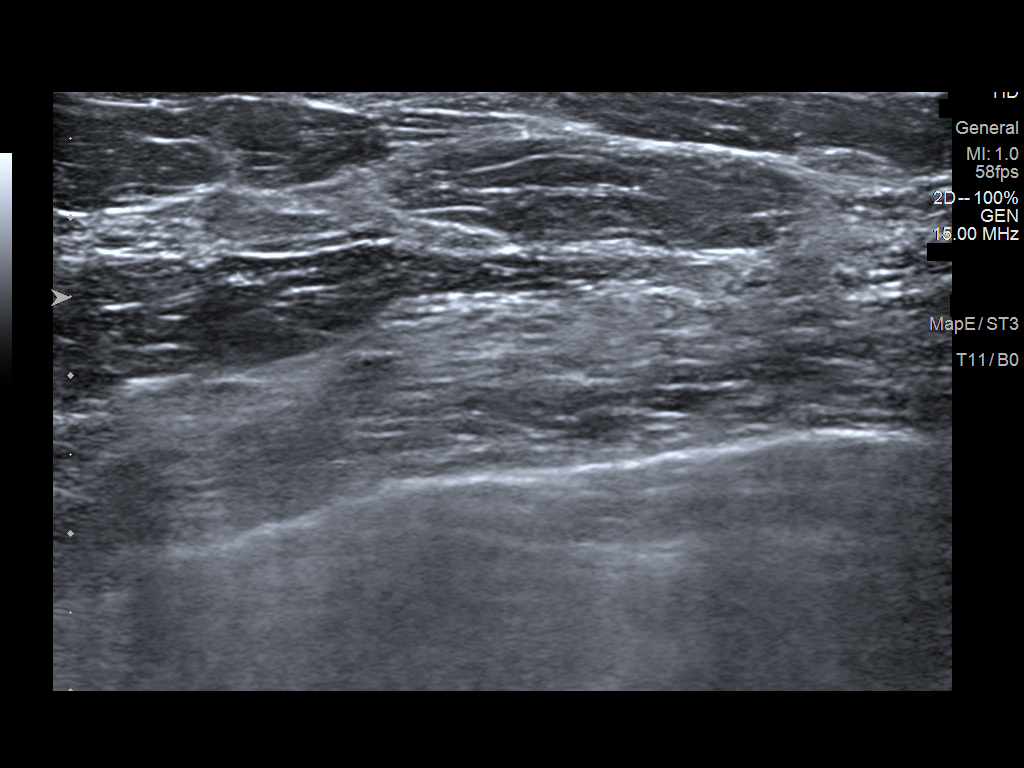
[im 2/3]
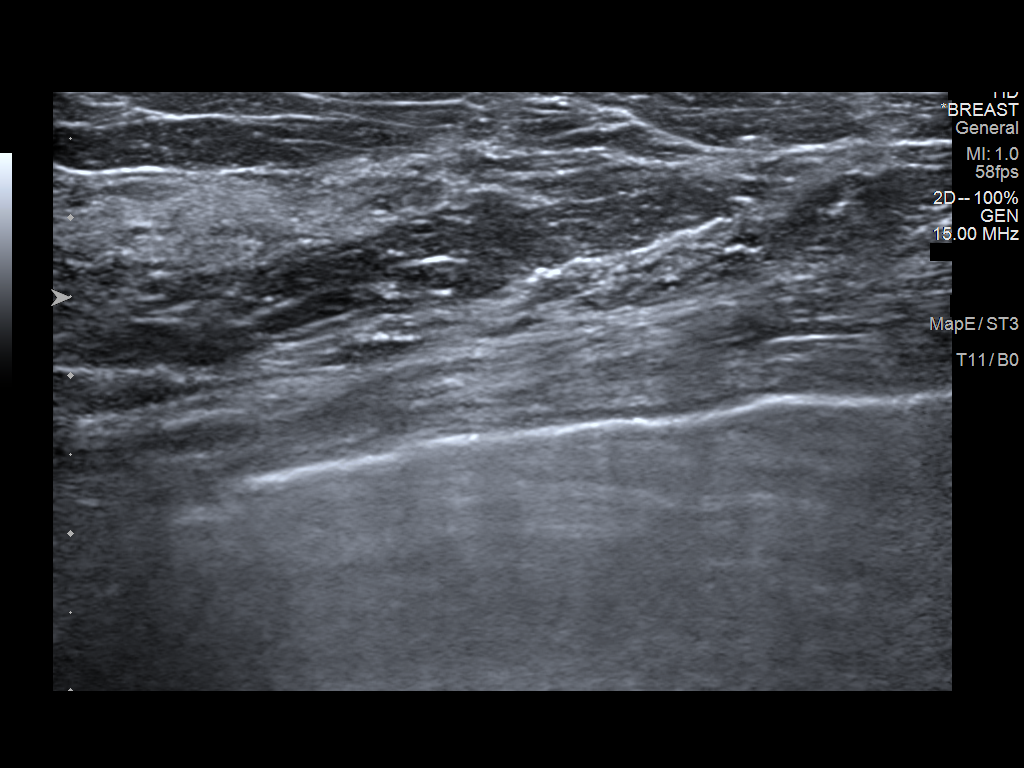
[im 3/3]
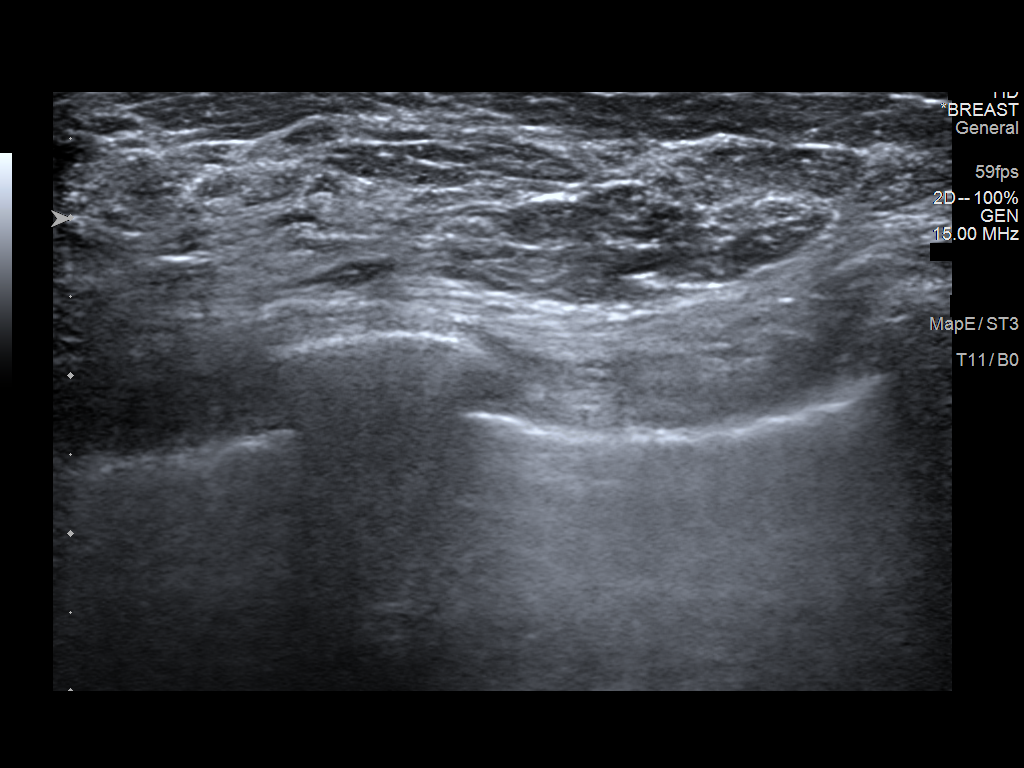

[3 of 3 positions shown; findings below may reference images not displayed]

ACR Breast Density Category c: The breast tissue is heterogeneously
dense, which may obscure small masses.
FINDINGS: Questioned asymmetry within the posterolateral left breast appear to
predominately effaced with additional imaging suggestive of dense
fibroglandular tissue.

Targeted ultrasound is performed, showing normal tissue without
suspicious mass within the outer left breast posterior depth.
IMPRESSION: No mammographic evidence for malignancy.

RECOMMENDATION:
Screening mammogram in one year.(Code:51-Y-KX4)

I have discussed the findings and recommendations with the patient.
If applicable, a reminder letter will be sent to the patient
regarding the next appointment.

BI-RADS CATEGORY  2: Benign.

## 2023-10-10 NOTE — Telephone Encounter (Signed)
 Please see pt msg and advise recommendations for patient prior to travel

## 2023-10-23 ENCOUNTER — Encounter: Payer: Self-pay | Admitting: Physician Assistant

## 2023-10-23 ENCOUNTER — Encounter: Payer: Self-pay | Admitting: Nurse Practitioner

## 2023-10-23 NOTE — Telephone Encounter (Signed)
 Please see pt msg and advise

## 2023-10-24 ENCOUNTER — Other Ambulatory Visit: Payer: Self-pay

## 2023-10-24 DIAGNOSIS — Z780 Asymptomatic menopausal state: Secondary | ICD-10-CM

## 2023-10-24 NOTE — Telephone Encounter (Signed)
 Can be sent to any alternate location. Her preference. Faster getting into [facility] or [place] but can do [facility] if she prefers [place].

## 2023-10-24 NOTE — Telephone Encounter (Signed)
 Order was placed for breast center. Patient lives in [place]. Do you have a preference for a location you would think would be good for her to go to?

## 2023-10-28 ENCOUNTER — Other Ambulatory Visit: Payer: Self-pay

## 2023-10-28 DIAGNOSIS — Z23 Encounter for immunization: Secondary | ICD-10-CM

## 2023-10-30 ENCOUNTER — Other Ambulatory Visit: Payer: Self-pay

## 2023-10-30 MED ORDER — COVID-19 MRNA VAC-TRIS(PFIZER) 30 MCG/0.3ML IM SUSY: 0.3000 mL | PREFILLED_SYRINGE | Freq: Once | INTRAMUSCULAR | 0 refills | Status: AC

## 2023-10-31 ENCOUNTER — Other Ambulatory Visit: Payer: Self-pay | Admitting: *Deleted

## 2023-10-31 DIAGNOSIS — Z23 Encounter for immunization: Secondary | ICD-10-CM

## 2023-10-31 MED ORDER — COVID-19 MRNA VACC (MODERNA) 50 MCG/0.5ML IM SUSP: 0.5000 mL | Freq: Once | INTRAMUSCULAR | 0 refills | Status: AC

## 2023-10-31 NOTE — Telephone Encounter (Signed)
 Prior to receiving message regarding generic replies for Covid vaccines, this patients Rx was sent twice to her pharmacy. Can you please contact patient directly to advise

## 2023-10-31 NOTE — Telephone Encounter (Signed)
 Spoke with pt and agreed to come pick up a printed Rx. Rx for Covid printed and placed at the front desk for pt to pick up.

## 2023-11-04 ENCOUNTER — Ambulatory Visit (INDEPENDENT_AMBULATORY_CARE_PROVIDER_SITE_OTHER): Admitting: Family Medicine

## 2023-11-04 ENCOUNTER — Ambulatory Visit: Payer: Self-pay

## 2023-11-04 VITALS — BP 149/82 | HR 85 | Temp 97.7°F | Ht 60.5 in | Wt 134.8 lb

## 2023-11-04 DIAGNOSIS — B309 Viral conjunctivitis, unspecified: Secondary | ICD-10-CM

## 2023-11-04 MED ORDER — ERYTHROMYCIN 5 MG/GM OP OINT: 1.0000 | TOPICAL_OINTMENT | Freq: Three times a day (TID) | OPHTHALMIC | 0 refills | Status: DC

## 2023-11-04 NOTE — Progress Notes (Signed)
 Subjective  CC:  Chief Complaint  Patient presents with   Eye Pain    Pt stated that he has been experiencing some eye pain since yesterday.     HPI: Shari Cook is a 63 y.o. female who presents to the office today to address the problems listed above in the chief complaint. Discussed the use of AI scribe software for clinical note transcription with the patient, who gave verbal consent to proceed. Same day acute visit; PCP not available. New pt to me. Chart reviewed.   History of Present Illness Shari Cook is a 63 year old female who presents with redness and discomfort in her right eye.  Her husband noticed redness in her right eye last night, which was the first time she became aware of the issue. Initially, there was no significant discomfort, but upon waking this morning, the redness persisted. Occasionally, she experiences a dull pain and a sensation of the eye being teary, although it is not excessively watery.  She felt the need to use eye drops yesterday before going out, which is unusual for her, indicating some level of irritation. No itchiness, scratchiness, or trauma to the eye. She has not been aware of any exposure to conjunctivitis.  No vision changes, itchiness, foreign body sensation, light sensitivity, or significant discharge.  She has a history of being evaluated by an allergist two years ago and was found not to be allergic to anything.   Assessment  1. Viral conjunctivitis, left eye      Plan  Assessment and Plan Assessment & Plan Viral conjunctivitis, right eye Mild viral conjunctivitis, likely self-limiting. Discussed contagious nature, hand hygiene, supportive care, and erythromycin  ointment for potential bacterial infection. Explained no contraindication for COVID vaccination unless high fever present. - Prescribe erythromycin  ointment if symptoms worsen or suggest bacterial infection. - Advise supportive care with Visine red eye  drops. - Instruct on hand hygiene. - Recommend cool compresses, avoid warm compresses. - Provide information on viral conjunctivitis via MyChart.    Follow up: prn No orders of the defined types were placed in this encounter.  Meds ordered this encounter  Medications   erythromycin  ophthalmic ointment    Sig: Place 1 Application into the left eye 3 (three) times daily.    Dispense:  3.5 g    Refill:  0     I reviewed the patients updated PMH, FH, and SocHx.  Patient Active Problem List   Diagnosis Date Noted   Multinodular goiter 09/20/2023   Encounter for annual physical exam 11/29/2021   Rash and nonspecific skin eruption 11/29/2021   Thyroid  nodule 11/26/2018   Family history of early CAD 07/03/2018   Benign essential HTN 06/13/2018   Hyperlipidemia, mixed 06/13/2018   Current Meds  Medication Sig   erythromycin  ophthalmic ointment Place 1 Application into the left eye 3 (three) times daily.   Allergies: Patient is allergic to statins. Family History: Patient family history includes Arthritis in her mother; Cancer in her mother; Diabetes in her maternal grandmother and mother; Early death in her father; Heart attack in her father; Heart disease in her father; Hypertension in her father; Kidney disease in her sister; Stroke in her son. Social History:  Patient  reports that she has never smoked. She has never used smokeless tobacco. She reports that she does not currently use alcohol. She reports that she does not use drugs.  Review of Systems: Constitutional: Negative for fever malaise or anorexia Cardiovascular: negative for  chest pain Respiratory: negative for SOB or persistent cough Gastrointestinal: negative for abdominal pain  Objective  Vitals: BP (!) 149/82   Pulse 85   Temp 97.7 F (36.5 C)   Ht 5' 0.5 (1.537 m)   Wt 134 lb 12.8 oz (61.1 kg)   LMP 10/20/2017   SpO2 99%   BMI 25.89 kg/m  General: no acute distress , A&Ox3 HEENT: PEERL, conjunctiva  lateral injection and erythema on left, nl on right. No photophobia.  No nasal congestion. Commons side effects, risks, benefits, and alternatives for medications and treatment plan prescribed today were discussed, and the patient expressed understanding of the given instructions. Patient is instructed to call or message via MyChart if he/she has any questions or concerns regarding our treatment plan. No barriers to understanding were identified. We discussed Red Flag symptoms and signs in detail. Patient expressed understanding regarding what to do in case of urgent or emergency type symptoms.  Medication list was reconciled, printed and provided to the patient in AVS. Patient instructions and summary information was reviewed with the patient as documented in the AVS. This note was prepared with assistance of Dragon voice recognition software. Occasional wrong-word or sound-a-like substitutions may have occurred due to the inherent limitations of voice recognition software

## 2023-11-04 NOTE — Patient Instructions (Signed)
 Please follow up if symptoms do not improve or as needed.    Viral Conjunctivitis, Adult  Viral conjunctivitis is an inflammation of the conjunctiva. The conjunctiva is the clear membrane that covers the white part of the eye and the inner surface of the eyelid. The inflammation is caused by a viral infection. The blood vessels in the conjunctiva become large, causing the eye to become red or pink and often itchy and tearing. The inflammation usually starts in one eye and goes to the other in a day or two. Infections usually go away over 1-2 weeks. Viral conjunctivitis is contagious. This means it can be easily passed from one person to another. This condition is often called pink eye. What are the causes? This condition is caused by a virus. It can be spread by touching objects that have been contaminated with the virus, such as doorknobs or towels, and then touching your eye. It can also be passed through tiny droplets, such as from coughing or sneezing. What increases the risk? You are more likely to develop this condition if you have a cold or the flu, or are in close contact with a person who has pink eye. What are the signs or symptoms? Symptoms of this condition include: Redness in the eye. Tearing or watery eyes. Itchy and irritated eyes. Burning feeling in the eyes. Clear drainage from the eye. Swollen eyelids. A gritty feeling in the eye. Light sensitivity. This condition often occurs with other symptoms, such as nasal congestion, cough, and fever. How is this diagnosed? This condition is diagnosed with a medical history and physical exam. If you have discharge from your eye, the discharge may be tested for a virus or to rule out other causes of conjunctivitis. How is this treated? Viral conjunctivitis does not respond to medicines that kill bacteria (antibiotics). The condition most often goes away on its own in 1-2 weeks. If treatment is needed, it is aimed at relieving your  symptoms and preventing the spread of infection. This may be done with artificial tear drops, antihistamine drops, or other eye medicines. In rare cases, steroid eye drops or anti-herpes virus medicines may be prescribed. Follow these instructions at home: Medicines  Take or apply over-the-counter and prescription medicines only as told by your health care provider. Do not touch the edge of the eyelid with the eye-drop bottle or ointment tube when applying medicines to the affected eye. This will prevent the spread of the infection to the other eye or to other people. Eye care Avoid touching or rubbing your eyes. Apply a clean, cool, wet washcloth onto your eye for 10-20 minutes, 3-4 times per day, or as told by your health care provider. If you wear contact lenses, do notwear them until the inflammation is gone and your health care provider says it is safe to wear them again. Ask your health care provider how to disinfect or replace your contact lenses before using them again. Wear glasses until you can resume wearing contacts. Avoid wearing eye makeup until the inflammation is gone. Throw away any old eye cosmetics that may be contaminated. Gently wipe away any crusting from your eye with a wet washcloth or a cotton ball. General instructions Change or wash your pillowcase every day or as told by your health care provider. Do not share towels, pillowcases, washcloths, eye makeup, makeup brushes, eye drops, contact lenses, or eyeglasses. This may spread the infection. Wash your hands often with soap and water. Use paper towels to dry  your hands. If soap and water are not available, use hand sanitizer. Avoid contact with other people until your eye is no longer red and tearing, or as told by your health care provider. Keep all follow-up visits. Contact a health care provider if: Your symptoms do not improve with treatment, or they get worse. You have increased pain. Your vision becomes  blurry. You have a fever. You have facial pain, redness, or swelling. You have yellow or green drainage coming from your eye. You have new symptoms. Get help right away if: You develop severe pain. Your vision gets much worse. Summary Viral conjunctivitis is an inflammation of the conjunctiva. It usually goes away in 1-2 weeks. The condition is caused by a virus and is spread by touching contaminated objects or breathing in droplets from a cough or a sneeze. This condition is usually treated with medicines and cold compresses to relieve the symptoms. Because it is caused by a virus, it should not be treated with antibiotics. This condition is very contagious. To prevent infection, avoid close contact with others, wash your hands often, and do not share towels or washcloths. Contact a health care provider if your symptoms do not go away with treatment, or if you have blurry vision, facial swelling, or increased pain. This information is not intended to replace advice given to you by your health care provider. Make sure you discuss any questions you have with your health care provider. Document Revised: 03/15/2021 Document Reviewed: 03/15/2021 Elsevier Patient Education  2024 [employer].

## 2023-11-04 NOTE — Telephone Encounter (Signed)
 FYI Only or Action Required?: FYI only for provider.  Patient was last seen in primary care on 01/23/2023 by Allwardt, Mardy HERO, PA-C.  Called Nurse Triage reporting Eye Pain.  Symptoms began today.  Interventions attempted: Nothing.  Symptoms are: unchanged.  Triage Disposition: See Physician Within 24 Hours  Patient/caregiver understands and will follow disposition?: Yes       Copied from CRM #[id number]. Topic: Clinical - Red Word Triage >> Nov 04, 2023 11:32 AM Shari Cook wrote: [facility] that prompted transfer to Nurse Triage: Left eye is a red color with some tears forming in the eye, and some pain. Symptoms started yesterday. Reason for Disposition  Eye pain present > 24 hours  Answer Assessment - Initial Assessment Questions 1. ONSET: When did the pain start? (Cook.g., minutes, hours, days)     yesterday 2. TIMING: Does the pain come and go, or has it been constant since it started? (Cook.g., constant, intermittent, fleeting)     constant 3. SEVERITY: How bad is the pain?  (Scale 1-10; mild, moderate or severe)     Mild  4. LOCATION: Where does it hurt?  (Cook.g., eyelid, eye, cheekbone)     Left eye 5. CAUSE: What do you think is causing the pain?     unknown 6. VISION: Do you have blurred vision or changes in your vision?      denies 7. EYE DISCHARGE: Is there any discharge (pus) from the eye(s)?  If Yes, ask: What color is it?      Just clear tears 8. FEVER: Do you have a fever? If Yes, ask: What is it, how was it measured, and when did it start?      denies 9. OTHER SYMPTOMS: Do you have any other symptoms? (Cook.g., headache, nasal discharge, facial rash)     Runny nose 10. PREGNANCY: Is there any chance you are pregnant? When was your last menstrual period?       na  Protocols used: Eye Pain and Other Symptoms-A-AH

## 2023-11-07 NOTE — Addendum Note (Signed)
 Addended by: Geramy Lamorte on: 11/07/2023 01:16 PM   Modules accepted: Orders

## 2023-11-12 ENCOUNTER — Encounter: Payer: Self-pay | Admitting: Physician Assistant

## 2023-11-12 NOTE — Telephone Encounter (Signed)
 Please see pt msg and advise recommendations

## 2023-11-25 ENCOUNTER — Telehealth: Payer: Self-pay

## 2023-11-25 ENCOUNTER — Encounter: Payer: Self-pay | Admitting: Physician Assistant

## 2023-11-25 ENCOUNTER — Other Ambulatory Visit: Payer: Self-pay

## 2023-11-25 NOTE — Telephone Encounter (Signed)
 I have already documented the request

## 2023-11-25 NOTE — Telephone Encounter (Signed)
 Pt would like to her medication to be sent in on 12/25/2023. She will be back off vacation by then.

## 2023-11-25 NOTE — Telephone Encounter (Signed)
 Copied from CRM #[id number]. Topic: Clinical - Medication Question >> Nov 25, 2023 11:33 AM [place] E wrote: Reason for CRM: Patient is leaving out of the country today and will be gone for 5 weeks, she has enough of her pravastatin  (PRAVACHOL ) 20 MG tablet and hydrochlorothiazide  (HYDRODIURIL ) 25 MG tablet for vacation, but requesting a refill to be submitted on November 5th so she will able to receive it when she gets back, as patient uses [facility] [employer].

## 2023-12-11 ENCOUNTER — Telehealth: Payer: Self-pay

## 2023-12-11 NOTE — Telephone Encounter (Signed)
 Copied from CRM [id number]. Topic: MyChart - Other >> Dec 10, 2023  3:43 PM Shari Cook ORN wrote: Reason for CRM: Got a notification from MyChart saying her test results are in. Hasn't had her labs done since August.  Sent pt a MyChart message advising and agreeing not showing any labs/tests performed with PCP ordered since August. Unsure of what this was but assured nothing from our office.

## 2024-01-01 ENCOUNTER — Other Ambulatory Visit: Payer: Self-pay | Admitting: Physician Assistant

## 2024-01-01 DIAGNOSIS — Z1231 Encounter for screening mammogram for malignant neoplasm of breast: Secondary | ICD-10-CM

## 2024-01-29 ENCOUNTER — Encounter: Payer: Self-pay | Admitting: Physician Assistant

## 2024-01-29 ENCOUNTER — Ambulatory Visit: Admitting: Physician Assistant

## 2024-01-29 VITALS — BP 122/76 | HR 82 | Temp 97.7°F | Ht 61.0 in | Wt 139.0 lb

## 2024-01-29 DIAGNOSIS — I10 Essential (primary) hypertension: Secondary | ICD-10-CM | POA: Diagnosis not present

## 2024-01-29 DIAGNOSIS — Z0000 Encounter for general adult medical examination without abnormal findings: Secondary | ICD-10-CM

## 2024-01-29 DIAGNOSIS — E782 Mixed hyperlipidemia: Secondary | ICD-10-CM | POA: Diagnosis not present

## 2024-01-29 DIAGNOSIS — Z131 Encounter for screening for diabetes mellitus: Secondary | ICD-10-CM | POA: Diagnosis not present

## 2024-01-29 DIAGNOSIS — Z23 Encounter for immunization: Secondary | ICD-10-CM

## 2024-01-29 LAB — LIPID PANEL
Cholesterol: 237 mg/dL — ABNORMAL HIGH (ref 0–200)
HDL: 113.8 mg/dL (ref >39.00)
LDL Cholesterol: 110 mg/dL — ABNORMAL HIGH (ref 0–99)
NonHDL: 123.66
Total CHOL/HDL Ratio: 2
Triglycerides: 69 mg/dL (ref 0.0–149.0)
VLDL: 13.8 mg/dL (ref 0.0–40.0)

## 2024-01-29 LAB — COMPREHENSIVE METABOLIC PANEL WITH GFR
ALT: 19 U/L (ref 0–35)
AST: 19 U/L (ref 0–37)
Albumin: 4.5 g/dL (ref 3.5–5.2)
Alkaline Phosphatase: 53 U/L (ref 39–117)
BUN: 13 mg/dL (ref 6–23)
CO2: 32 meq/L (ref 19–32)
Calcium: 9.7 mg/dL (ref 8.4–10.5)
Chloride: 103 meq/L (ref 96–112)
Creatinine, Ser: 0.6 mg/dL (ref 0.40–1.20)
GFR: 97.12 mL/min (ref >60.00)
Glucose, Bld: 81 mg/dL (ref 70–99)
Potassium: 4.3 meq/L (ref 3.5–5.1)
Sodium: 142 meq/L (ref 135–145)
Total Bilirubin: 1 mg/dL (ref 0.2–1.2)
Total Protein: 6.6 g/dL (ref 6.0–8.3)

## 2024-01-29 LAB — HEMOGLOBIN A1C: Hgb A1c MFr Bld: 5.5 % (ref 4.6–6.5)

## 2024-01-29 LAB — CBC WITH DIFFERENTIAL/PLATELET
Basophils Absolute: 0.1 K/uL (ref 0.0–0.1)
Basophils Relative: 1.7 % (ref 0.0–3.0)
Eosinophils Absolute: 0.3 K/uL (ref 0.0–0.7)
Eosinophils Relative: 5.9 % — ABNORMAL HIGH (ref 0.0–5.0)
HCT: 42.6 % (ref 36.0–46.0)
Hemoglobin: 14.4 g/dL (ref 12.0–15.0)
Lymphocytes Relative: 34 % (ref 12.0–46.0)
Lymphs Abs: 1.6 K/uL (ref 0.7–4.0)
MCHC: 33.9 g/dL (ref 30.0–36.0)
MCV: 91.3 fl (ref 78.0–100.0)
Monocytes Absolute: 0.4 K/uL (ref 0.1–1.0)
Monocytes Relative: 8.3 % (ref 3.0–12.0)
Neutro Abs: 2.3 K/uL (ref 1.4–7.7)
Neutrophils Relative %: 50.1 % (ref 43.0–77.0)
Platelets: 264 K/uL (ref 150.0–400.0)
RBC: 4.67 Mil/uL (ref 3.87–5.11)
RDW: 12.7 % (ref 11.5–15.5)
WBC: 4.6 K/uL (ref 4.0–10.5)

## 2024-01-29 LAB — TSH: TSH: 2.38 u[IU]/mL (ref 0.35–5.50)

## 2024-01-29 NOTE — Progress Notes (Signed)
 Patient ID: Shari Cook, female    DOB: [birth year], 63 y.o.   MRN: [id number]   Assessment & Plan:  Annual physical exam -     CBC with Differential/Platelet -     Comprehensive metabolic panel with GFR -     Hemoglobin A1c -     Lipid panel -     TSH  Immunization due -     Pneumococcal conjugate vaccine 20-valent  Hyperlipidemia, mixed -     Lipid panel  Benign essential HTN -     Comprehensive metabolic panel with GFR     Assessment & Plan Mixed hyperlipidemia Managed with Pravachol  20 mg daily. She tolerates the medication well without significant side effects. Previous intolerance to statins noted, but current regimen is effective. - Continue Pravachol  20 mg daily - Consider CoQ10 supplementation to prevent muscle aches - Ensure adequate vitamin D   Essential hypertension Well-controlled with hydrochlorothiazide  25 mg daily. No recent changes in blood pressure or medication tolerance reported. - Continue hydrochlorothiazide  25 mg daily  General Health Maintenance Routine health maintenance is up to date. Pneumonia vaccine administered today. Mammogram and bone density screening are scheduled. Cervical cancer screening was normal in 2023, and Pap smears are no longer required after age 92. Colon cancer screening is current. Regular gynecological follow-ups are maintained. - Ordered CBC, CMP, A1c, lipid panel, TSH - Ensure mammogram and bone density screening are completed as scheduled - Continue regular gynecological follow-ups  Age-appropriate screening and counseling performed today. Will check labs and call with results. Preventive measures discussed and printed in AVS for patient.      Subjective:    Chief Complaint  Patient presents with   Annual Exam    Pt in office for annual CPE and labs; pt reports no changes in health since last CPE;     HPI Discussed the use of AI scribe software for clinical note transcription with the patient, who gave  verbal consent to proceed.  History of Present Illness Shari Cook is a 63 year old female who presents for an annual physical exam.  No significant health changes since her last visit in December of the previous year. She received her pneumonia vaccine today.  Hypertension is managed with hydrochlorothiazide  25 mg daily. History of high cholesterol managed with Pravachol  20 mg daily, which she tolerates well after previous issues with other statins.  Engages in regular physical activity, specifically taking 20-minute walks, and maintains a balanced diet. Rare alcohol use, no smoking or vaping.  Occasional reflux issues managed by dietary adjustments to avoid foods that cause gas, such as cherries and apples.  Occasional skin breakouts on her chin and a recurring rash that resolves with topical treatment. Long-standing extra area on her eyelid near the tear duct, present for years.  Sleeps mostly well, though occasionally has trouble falling asleep. No postmenopausal symptoms such as hot flashes or mood changes.  Scheduled for a mammogram and a bone density test in the spring. Cervical cancer screening in 2023 was normal, and she sees her gynecologist annually.     Past Medical History:  Diagnosis Date   Allergy    GERD (gastroesophageal reflux disease) unsure   History of chicken pox    Hyperlipidemia    Hypertension    Myocardial infarction (HCC)    Recurrent upper respiratory infection (URI)    Status post dilation and curettage     Past Surgical History:  Procedure Laterality Date   ADENOIDECTOMY  DILATION AND CURETTAGE OF UTERUS     TONSILLECTOMY     WISDOM TOOTH EXTRACTION      Family History  Problem Relation Age of Onset   Arthritis Mother    Cancer Mother    Diabetes Mother    Early death Father    Heart disease Father    Hypertension Father    Heart attack Father    Kidney disease Sister    Diabetes Maternal Grandmother    Stroke Son      Social History   Tobacco Use   Smoking status: Never   Smokeless tobacco: Never  Vaping Use   Vaping status: Never Used  Substance Use Topics   Alcohol use: Yes    Comment: one or two drinks/month   Drug use: Never     Allergies  Allergen Reactions   Statins Other (See Comments)    Muscle pain w/statin drugs    Review of Systems NEGATIVE UNLESS OTHERWISE INDICATED IN HPI      Objective:     BP 122/76 (BP Location: Left Arm, Patient Position: Sitting, Cuff Size: Normal)   Pulse 82   Temp 97.7 F (36.5 C) (Temporal)   Ht 5' 1 (1.549 m)   Wt 139 lb (63 kg)   LMP 10/20/2017   SpO2 97%   BMI 26.26 kg/m   Wt Readings from Last 3 Encounters:  01/29/24 139 lb (63 kg)  11/04/23 134 lb 12.8 oz (61.1 kg)  07/22/23 134 lb (60.8 kg)    BP Readings from Last 3 Encounters:  01/29/24 122/76  11/04/23 (!) 149/82  09/20/23 120/80     Physical Exam Vitals and nursing note reviewed.  Constitutional:      Appearance: Normal appearance. She is normal weight. She is not toxic-appearing.  HENT:     Head: Normocephalic and atraumatic.     Right Ear: Tympanic membrane, ear canal and external ear normal.     Left Ear: Tympanic membrane, ear canal and external ear normal.     Nose: Nose normal.     Mouth/Throat:     Mouth: Mucous membranes are moist.  Eyes:     Extraocular Movements: Extraocular movements intact.     Conjunctiva/sclera: Conjunctivae normal.     Pupils: Pupils are equal, round, and reactive to light.   Cardiovascular:     Rate and Rhythm: Normal rate and regular rhythm.     Pulses: Normal pulses.     Heart sounds: Normal heart sounds.  Pulmonary:     Effort: Pulmonary effort is normal.     Breath sounds: Normal breath sounds.  Musculoskeletal:        General: Normal range of motion.     Cervical back: Normal range of motion and neck supple.  Skin:    General: Skin is warm and dry.     Findings: No lesion or rash.  Neurological:     General:  No focal deficit present.     Mental Status: She is alert and oriented to person, place, and time.  Psychiatric:        Mood and Affect: Mood normal.        Behavior: Behavior normal.             Camarion Weier M Titiana Severa, PA-C

## 2024-01-30 ENCOUNTER — Ambulatory Visit: Payer: Self-pay | Admitting: Physician Assistant

## 2024-02-06 ENCOUNTER — Inpatient Hospital Stay: Admission: RE | Admit: 2024-02-06 | Discharge: 2024-02-06 | Attending: Physician Assistant

## 2024-02-06 DIAGNOSIS — Z1231 Encounter for screening mammogram for malignant neoplasm of breast: Secondary | ICD-10-CM

## 2024-02-24 ENCOUNTER — Encounter: Payer: Self-pay | Admitting: Physician Assistant

## 2024-02-25 ENCOUNTER — Other Ambulatory Visit: Payer: Self-pay

## 2024-02-25 MED ORDER — HYDROCHLOROTHIAZIDE 25 MG PO TABS: 25.0000 mg | ORAL_TABLET | Freq: Every day | ORAL | 3 refills | Status: DC

## 2024-02-25 MED ORDER — PRAVASTATIN SODIUM 20 MG PO TABS: 20.0000 mg | ORAL_TABLET | Freq: Every day | ORAL | 1 refills | Status: DC

## 2024-02-25 NOTE — Telephone Encounter (Signed)
 Can someone please review patient message and advise if her new insurance was added correctly.

## 2024-02-25 NOTE — Telephone Encounter (Signed)
 TAKEN CARE OF, INSURANCE UPDATED! AW

## 2024-03-04 ENCOUNTER — Encounter: Payer: Self-pay | Admitting: Physician Assistant

## 2024-03-05 ENCOUNTER — Other Ambulatory Visit: Payer: Self-pay

## 2024-03-05 MED ORDER — PRAVASTATIN SODIUM 20 MG PO TABS: 20.0000 mg | ORAL_TABLET | Freq: Every day | ORAL | 1 refills | Status: AC

## 2024-03-05 MED ORDER — HYDROCHLOROTHIAZIDE 25 MG PO TABS: 25.0000 mg | ORAL_TABLET | Freq: Every day | ORAL | 3 refills | Status: AC

## 2024-06-22 ENCOUNTER — Ambulatory Visit (HOSPITAL_BASED_OUTPATIENT_CLINIC_OR_DEPARTMENT_OTHER)
